# Patient Record
Sex: Female | Born: 1965
Health system: Southern US, Community
[De-identification: ages and names within clinical notes are randomized; demographics above are authoritative.]

## PROBLEM LIST (undated history)

## (undated) DIAGNOSIS — R011 Cardiac murmur, unspecified: Secondary | ICD-10-CM

---

## 2002-11-08 ENCOUNTER — Other Ambulatory Visit: Admission: RE | Admit: 2002-11-08 | Discharge: 2002-11-08 | Payer: Self-pay | Admitting: Obstetrics & Gynecology

## 2003-12-09 ENCOUNTER — Other Ambulatory Visit: Admission: RE | Admit: 2003-12-09 | Discharge: 2003-12-09 | Payer: Self-pay | Admitting: Obstetrics & Gynecology

## 2003-12-13 ENCOUNTER — Encounter: Admission: RE | Admit: 2003-12-13 | Discharge: 2003-12-13 | Payer: Self-pay | Admitting: Obstetrics & Gynecology

## 2004-12-22 ENCOUNTER — Other Ambulatory Visit: Admission: RE | Admit: 2004-12-22 | Discharge: 2004-12-22 | Payer: Self-pay | Admitting: Obstetrics & Gynecology

## 2004-12-31 ENCOUNTER — Encounter: Admission: RE | Admit: 2004-12-31 | Discharge: 2004-12-31 | Payer: Self-pay | Admitting: Obstetrics & Gynecology

## 2006-02-12 HISTORY — PX: BREAST BIOPSY: SHX20

## 2006-02-15 ENCOUNTER — Encounter: Admission: RE | Admit: 2006-02-15 | Discharge: 2006-02-15 | Payer: Self-pay | Admitting: Obstetrics & Gynecology

## 2006-02-15 ENCOUNTER — Encounter (INDEPENDENT_AMBULATORY_CARE_PROVIDER_SITE_OTHER): Payer: Self-pay | Admitting: Specialist

## 2006-02-22 ENCOUNTER — Encounter: Admission: RE | Admit: 2006-02-22 | Discharge: 2006-02-22 | Payer: Self-pay | Admitting: General Surgery

## 2007-03-13 ENCOUNTER — Encounter: Admission: RE | Admit: 2007-03-13 | Discharge: 2007-03-13 | Payer: Self-pay | Admitting: Obstetrics & Gynecology

## 2007-04-04 ENCOUNTER — Encounter: Admission: RE | Admit: 2007-04-04 | Discharge: 2007-04-04 | Payer: Self-pay | Admitting: Obstetrics & Gynecology

## 2007-12-11 ENCOUNTER — Encounter: Admission: RE | Admit: 2007-12-11 | Discharge: 2007-12-11 | Payer: Self-pay | Admitting: Obstetrics & Gynecology

## 2008-03-13 ENCOUNTER — Encounter: Admission: RE | Admit: 2008-03-13 | Discharge: 2008-03-13 | Payer: Self-pay | Admitting: Obstetrics & Gynecology

## 2009-03-18 ENCOUNTER — Ambulatory Visit: Payer: Self-pay | Admitting: Sports Medicine

## 2009-03-18 DIAGNOSIS — M2170 Unequal limb length (acquired), unspecified site: Secondary | ICD-10-CM | POA: Insufficient documentation

## 2009-03-18 DIAGNOSIS — M4120 Other idiopathic scoliosis, site unspecified: Secondary | ICD-10-CM | POA: Insufficient documentation

## 2009-03-18 DIAGNOSIS — R269 Unspecified abnormalities of gait and mobility: Secondary | ICD-10-CM

## 2009-03-18 DIAGNOSIS — M6740 Ganglion, unspecified site: Secondary | ICD-10-CM | POA: Insufficient documentation

## 2009-04-09 ENCOUNTER — Encounter: Admission: RE | Admit: 2009-04-09 | Discharge: 2009-04-09 | Payer: Self-pay | Admitting: Obstetrics & Gynecology

## 2010-04-05 ENCOUNTER — Encounter: Payer: Self-pay | Admitting: Obstetrics & Gynecology

## 2010-04-16 NOTE — Assessment & Plan Note (Signed)
Summary: NP RUNNER W/ R ARCH PAIN X 2 MOS AND L LATERAL NODULE X AUG   Vital Signs:  Patient profile:   45 year old female Height:      66 inches Weight:      147 pounds BMI:     23.81 BP sitting:   107 / 73  Vitals Entered By: Lillia Pauls CMA (March 18, 2009 10:26 AM)  History of Present Illness: Pt presents today with 2 problems.  She is a casual runner who recently completed 2 half marathons.    #1 - L lateral dorsal foot nodule that appeared in August without pain.  Since that time she reports some decrease in size but has not changed much lately.  She continues to have no pain and the nodule does not affect her day to day life or activities.    #2 - R medial foot pain.  The pain appears approximatly after 5 miles but is not severe enough to cause her to stop running. .  She does occasionally have some cramping in that area at night but does not associate it with any changes in activity level.  There is no discomfort during the day unless running greater than 5 miles.  She has not tried any medications, or specific exercsies or stretches; no orthotic footbeds.    Allergies (verified): No Known Drug Allergies  Physical Exam  General:  alert, well-developed, and well-nourished.   Msk:  Bilateral Foot Exam:  normal ROM, no joint tenderness, no joint swelling, no joint warmth, no redness over joints, and no joint deformities.    L foot:  1 cm cystic nodule on the lateral aspect along fibularis tertius tendon.  R foot: normal to exam area where she experiences pain is along insertion of post tib into navicular - not TTP  Leg Length: L = 85cm; R = 86cm Extremities:  running gait shows excess supination of left foot and ext rotation of RT foot with pronation   Impression & Recommendations:  Problem # 1:  GAIT ABNORMALITY (ICD-781.2) Assessment New  Insoles fitted with a heel lift and scaphoid pad to correct leg length disparity and provide arch support.  Pt given  strengthening exercises for her hip and ankles.    will reck in 4 to 6 weeks to see if this intervention helps  Orders: Sports Insoles (N5621)  Problem # 2:  GANGLION NOS (ICD-727.43) Assessment: New Pt advised to do exercises as above and monitor cyst for any changes.    Problem # 3:  UNEQUAL LEG LENGTH (ICD-736.81) Assessment: New  Heel pad placed; see assessment #1  Orders: Sports Insoles (H0865)  Problem # 4:  SCOLIOSIS (ICD-737.30) she stands very level and the curve is not significant and seems mostly compensated by her leg length diff  Patient Instructions: 1)  At home exercises: 2)  Foot Exercises: A) Walk Pige[place]'d toed for 10 steps on your toes back and forth 10 times.  Once you are able to do this easily progress to doing this up a set of steps. B) Stand against a wall with the outside of your left foot against the wall.  Turn your foot outwards against the wall and hold for 6 seconds.  Repeat 10 times.   3)    4)  Right hip exercises:  A) Stand on your Left leg; lift your right leg out to your right side while keeping it straight.  Do three sets of 15 reps. B) Stand on your Left  leg; Bend your right hip and knee in front of you to 90 degrees.  turn your foot outwards.  Do three sets of 15 reps 5)  Follow up in 4-6 weeks.

## 2010-04-23 ENCOUNTER — Other Ambulatory Visit: Payer: Self-pay | Admitting: Obstetrics & Gynecology

## 2010-04-23 DIAGNOSIS — Z1231 Encounter for screening mammogram for malignant neoplasm of breast: Secondary | ICD-10-CM

## 2010-05-05 ENCOUNTER — Ambulatory Visit
Admission: RE | Admit: 2010-05-05 | Discharge: 2010-05-05 | Disposition: A | Discharge status: Home / Self Care | Payer: BC Managed Care – PPO | Source: Ambulatory Visit | Attending: Obstetrics & Gynecology | Admitting: Obstetrics & Gynecology

## 2010-05-05 DIAGNOSIS — Z1231 Encounter for screening mammogram for malignant neoplasm of breast: Secondary | ICD-10-CM

## 2010-06-23 ENCOUNTER — Encounter: Payer: Self-pay | Admitting: Sports Medicine

## 2010-06-23 ENCOUNTER — Ambulatory Visit (INDEPENDENT_AMBULATORY_CARE_PROVIDER_SITE_OTHER): Payer: BC Managed Care – PPO | Admitting: Sports Medicine

## 2010-06-23 VITALS — BP 114/75 | HR 69 | Ht 66.0 in | Wt 145.0 lb

## 2010-06-23 DIAGNOSIS — M7740 Metatarsalgia, unspecified foot: Secondary | ICD-10-CM

## 2010-06-23 DIAGNOSIS — R269 Unspecified abnormalities of gait and mobility: Secondary | ICD-10-CM

## 2010-06-23 DIAGNOSIS — M25551 Pain in right hip: Secondary | ICD-10-CM

## 2010-06-23 DIAGNOSIS — M25559 Pain in unspecified hip: Secondary | ICD-10-CM

## 2010-06-23 DIAGNOSIS — M7060 Trochanteric bursitis, unspecified hip: Secondary | ICD-10-CM | POA: Insufficient documentation

## 2010-06-23 DIAGNOSIS — M76899 Other specified enthesopathies of unspecified lower limb, excluding foot: Secondary | ICD-10-CM

## 2010-06-23 DIAGNOSIS — M7750 Other enthesopathy of unspecified foot: Secondary | ICD-10-CM

## 2010-06-23 NOTE — Assessment & Plan Note (Signed)
She may be a candidate for custom orthotics if she does not get enough relief from her problems with the temporary sports insoles

## 2010-06-23 NOTE — Assessment & Plan Note (Signed)
Hip pain on the right side has been fairly persistent over the last several months. The hip joint itself does seem to be stable with excellent range of motion so this points toward involvement of the bursa. I do suspect that the leg length abnormality was left to help trigger this as it does change her gait.

## 2010-06-23 NOTE — Assessment & Plan Note (Signed)
Metatarsal pads were added to his sports in soles.  Her first pair lasted almost 9 months and gave her good relief from her symptoms. We did add a small amount of a lift on the left insole.  Today we continued with the lift on the left but add metatarsal pads to both. In addition we added some metatarsal pads to her regular shoes. She was to see how this does in relieving her symptoms and we will check her back in approximately 6 weeks.

## 2010-06-23 NOTE — Progress Notes (Signed)
  Subjective:    Patient ID: Veronica Calderon, female    DOB: 28-Oct-1965, 45 y.o.   MRN: [id number]  HPI  Pt presents to clinic for evaluation of rt foot and hip pain.   Has been using hapad temporary insoles with leg length corrected and lateral posting that have been comfortable. Started running in 2010- did 2 half marathons, in 2011 was only able to do 3 -5 Ks due to hip pain.   Rt hip is painful laterally over greater trochanter starts about 10 minutes into run.  She has been experiencing this for 6 months.  Has rt foot pain between 3rd and 4th toes plantar aspect x 1 month- feels like stinging and pressure only with weight bearing.    Has stopped running since the fall of 2011 due to hip pain.  Now riding stationary bike for exercise.   Review of Systems     Objective:   Physical Exam    Morton's foot on lt with long 2nd and 3rd toe, not as much so on rt Widening of both forefeet, small bunionette on lt Moderately high arches No calcaneal valgus Good post tib function Splaying between 1st and 2nd toes bilat Large morton's callus on rt foot 4th MT subluxed on rt, with early callusing  Rt forefoot 1.8 times width of heel Morton's callus on lt 5 th MT subluxed Lt forefoot is 1.8 times width of heel  Good hip ROM bilat Lt leg slightly shorter than RT SI joints freely move Good hip abduction strength bilat Tenderness to palpation over rt greater trochanter  Assessment & Plan:

## 2010-06-23 NOTE — Patient Instructions (Signed)
Do recommended hip stretches and exercises daily

## 2010-06-23 NOTE — Assessment & Plan Note (Signed)
Given a series of exercises for hip rotators and hip abductors. She can use some Aleve or Advil as these both seemed to help. Should it not respond to conservative care over the next 6 weeks I would consider injecting the bursa.

## 2010-07-30 ENCOUNTER — Ambulatory Visit (INDEPENDENT_AMBULATORY_CARE_PROVIDER_SITE_OTHER): Payer: BC Managed Care – PPO | Admitting: Sports Medicine

## 2010-07-30 ENCOUNTER — Encounter: Payer: Self-pay | Admitting: Sports Medicine

## 2010-07-30 VITALS — BP 107/70

## 2010-07-30 DIAGNOSIS — M25551 Pain in right hip: Secondary | ICD-10-CM

## 2010-07-30 DIAGNOSIS — M25559 Pain in unspecified hip: Secondary | ICD-10-CM

## 2010-07-30 DIAGNOSIS — M7060 Trochanteric bursitis, unspecified hip: Secondary | ICD-10-CM

## 2010-07-30 DIAGNOSIS — M76899 Other specified enthesopathies of unspecified lower limb, excluding foot: Secondary | ICD-10-CM

## 2010-07-30 NOTE — Assessment & Plan Note (Signed)
There is mild swelling of the bursa today but not any extreme tenderness or significant enough pain that she would like an injection. If the pain continues to resolve with exercises and avoiding aggravating features I don't think we need to any medications. If pain worsens we will consider using nitroglycerin protocol or proceeding with injection.  She will see how this responds over the next 2 months and return if needed.

## 2010-07-30 NOTE — Progress Notes (Signed)
  Subjective:    Patient ID: Veronica Calderon, female    DOB: 11-12-1965, 45 y.o.   MRN: [id number]  HPI  Pt presents to clinic for f/u of R hip pain.  Feels that she has improved- able to run about 20 minutes before pain starts.  Able to run a 5K without stopping due to pain - hip pain started around 22 minutes into race.  Was compliant with HEP initially but has now stopped exercises.  Started back to body pump within the past 2 weeks and feels that squats in class may have aggravated rt hip and caused flaring of rt hip pain. Pain still located near RT greater trochanter. sports insoles did help her metatarsal pain.   Review of Systems     Objective:   Physical Exam     Hip ROM normal bilat Leg length abnormality noted- Rt 1 cm longer than lt SI joints move freely FABER tight bilat lt more than rt Hip abduction strong bilat Hip rotation strong bilat Tenderness over area of rt greater trochanter- near hip rotator tendons   Musculoskeletal ultrasound Scanning of the right hip reveals that there is a mild amount of fluid in the greater trochanteric bursa The Doppler activity around this area looks normal There is some calcification in the distal portion of the tendon of piriformis muscle proximal to attachment to the greater trochanter There are no tears seen in the tendons.  Assessment & Plan:

## 2010-07-30 NOTE — Assessment & Plan Note (Signed)
She was improving significantly until she returned to body pump. I think in the body pump she's getting into some deep squat maneuvers that are allowing her to irritate the tendon again. I suggested resuming the exercises as before. Add a few more stretches for hip rotators. Avoid deep squats or positions that strain the hip rotators

## 2011-04-22 ENCOUNTER — Other Ambulatory Visit: Payer: Self-pay | Admitting: Obstetrics & Gynecology

## 2011-04-22 DIAGNOSIS — Z1231 Encounter for screening mammogram for malignant neoplasm of breast: Secondary | ICD-10-CM

## 2011-05-07 ENCOUNTER — Ambulatory Visit
Admission: RE | Admit: 2011-05-07 | Discharge: 2011-05-07 | Disposition: A | Discharge status: Home / Self Care | Payer: BC Managed Care – PPO | Source: Ambulatory Visit | Attending: Obstetrics & Gynecology | Admitting: Obstetrics & Gynecology

## 2011-05-07 DIAGNOSIS — Z1231 Encounter for screening mammogram for malignant neoplasm of breast: Secondary | ICD-10-CM

## 2011-05-11 ENCOUNTER — Other Ambulatory Visit: Payer: Self-pay | Admitting: Obstetrics & Gynecology

## 2011-05-11 DIAGNOSIS — R928 Other abnormal and inconclusive findings on diagnostic imaging of breast: Secondary | ICD-10-CM

## 2011-05-19 ENCOUNTER — Other Ambulatory Visit: Payer: Self-pay | Admitting: Obstetrics & Gynecology

## 2011-05-19 ENCOUNTER — Ambulatory Visit
Admission: RE | Admit: 2011-05-19 | Discharge: 2011-05-19 | Disposition: A | Discharge status: Home / Self Care | Payer: BC Managed Care – PPO | Source: Ambulatory Visit | Attending: Obstetrics & Gynecology | Admitting: Obstetrics & Gynecology

## 2011-05-19 DIAGNOSIS — R928 Other abnormal and inconclusive findings on diagnostic imaging of breast: Secondary | ICD-10-CM

## 2012-03-09 ENCOUNTER — Ambulatory Visit (INDEPENDENT_AMBULATORY_CARE_PROVIDER_SITE_OTHER): Payer: BC Managed Care – PPO | Admitting: Sports Medicine

## 2012-03-09 VITALS — BP 110/70 | Ht 66.0 in | Wt 146.0 lb

## 2012-03-09 DIAGNOSIS — M25519 Pain in unspecified shoulder: Secondary | ICD-10-CM

## 2012-03-09 DIAGNOSIS — M7580 Other shoulder lesions, unspecified shoulder: Secondary | ICD-10-CM

## 2012-03-09 DIAGNOSIS — M719 Bursopathy, unspecified: Secondary | ICD-10-CM

## 2012-03-09 DIAGNOSIS — M7060 Trochanteric bursitis, unspecified hip: Secondary | ICD-10-CM

## 2012-03-09 DIAGNOSIS — M76899 Other specified enthesopathies of unspecified lower limb, excluding foot: Secondary | ICD-10-CM

## 2012-03-09 MED ORDER — MELOXICAM 15 MG PO TABS: ORAL_TABLET | ORAL | Status: DC

## 2012-03-09 NOTE — Progress Notes (Signed)
  Subjective:    Patient ID: Veronica Calderon, female    DOB: 06/11/1965, 46 y.o.   MRN: [id number]  HPI chief complaint: Right shoulder pain, right hip pain  Very pleasant right-hand-dominant 46 year old female comes in today complaining of 2-3 months of right shoulder pain. Symptoms began acutely when she was lying down a book that she was reading. She placed her shoulder into an abducted externally rotated position as she laid the book down. Since then, she's had intermittent pain along the lateral shoulder which is worse with reaching out away from her body. She especially notices it with activity such as reaching in the dryer. Pain will occasionally awaken her at night. She denies problems with this shoulder in the past. She is getting some mild neck pain, no associated numbness or tingling down the right arm. Pain at times will radiate down the arm to the elbow but not into the forearm or the fingers. She's taking intermittent doses of Advil. She's found biofreeze to be the most helpful. No significant weakness.  In regards to her right hip she has been treated in the past for greater trochanteric bursitis. This was treated with a home exercise program with complete resolution of her pain. He is recently started to train for half marathon and has had a return of symptoms identical in nature to what she experienced previously. Pain is all along the lateral hip. No pain in the groin. No pain at rest.  She is otherwise healthy.    Review of Systems     Objective:   Physical Exam Well-developed, well-nourished. No acute distress. Awake alert and oriented x3  Right shoulder: Full range of motion with a positive painful ARC. No tenderness along the clavicle or over the a.c. joint. No tenderness over the bicipital groove. Rotator cuff strength is 5/5 bilaterally but reproducible pain with resisted supraspinatus on the right. Positive empty can, positive Hawkins. Neurovascularly intact  distally.  Right hip: Discrete tenderness to palpation directly over the right greater trochanteric bursa. Smooth painless hip range of motion with a negative log roll. Straight leg raise is negative. She walks without a limp.       Assessment & Plan:  1. Right shoulder pain secondary to subacromial bursitis/rotator cuff tendinitis 2. Right hip pain secondary to greater trochanteric bursitis  Patient's right subacromial space was injected with cortisone today. Mobic 15 mg daily for the next 7 days then when necessary. He is cautioned about GI upset with his medicine. A comprehensive home exercise program for both the right shoulder and the right hip were given. Patient will followup in 3 weeks. We will tentatively schedule a 30 minute ultrasound appointment for complete shoulder ultrasound. If patient is markedly improved, we may cancel the study.  Of note, she has a pair of green sports insole which are becoming worn. We will need to schedule a time to construct her some custom orthotics. We will plan on discussing this further at her next office visit.  Consent obtained and verified. Time-out conducted. Noted no overlying erythema, induration, or other signs of local infection. Skin prepped in a sterile fashion. Topical analgesic spray: Ethyl chloride. Joint: right subacromial space Needle: 25g 1 1/2 in needle Completed without difficulty. Meds: 1cc (40 mg) depomedrol, 3cc 0.5% marcaine  Advised to call if fevers/chills, erythema, induration, drainage, or persistent bleeding.

## 2012-04-03 ENCOUNTER — Ambulatory Visit (INDEPENDENT_AMBULATORY_CARE_PROVIDER_SITE_OTHER): Payer: BC Managed Care – PPO | Admitting: Sports Medicine

## 2012-04-03 VITALS — BP 100/64 | Ht 66.0 in | Wt 145.0 lb

## 2012-04-03 DIAGNOSIS — M25519 Pain in unspecified shoulder: Secondary | ICD-10-CM

## 2012-04-04 NOTE — Progress Notes (Signed)
  Subjective:    Patient ID: Veronica Calderon, female    DOB: Nov 01, 1965, 47 y.o.   MRN: [id number]  HPI  Patient comes in today for followup on right shoulder pain. She feels like she is "80% better" after a recent subacromial cortisone injection. However, she is still struggling with repetitive overhead motion. Still occasional nighttime pain. Majority of her pain is along the lateral aspect of her shoulder. She is currently training for half marathon. She bought new shoes a couple of weeks ago and after a long run began to have bilateral knee and hip pain. She resumed running and her old shoes and her symptoms improved although she is still having some slight discomfort along the lateral right hip. We did help previously about custom orthotics. She has a pair of green sports insoles which she has had for quite some time. He finds them to be quite useful and comfortable.    Review of Systems     Objective:   Physical Exam Well-developed, well-nourished. No acute distress. Awake alert and oriented x3  Right shoulder: Full range of motion with a positive painful ARC. No tenderness over the a.c. joint or over the bicipital groove. Mild pain with empty can testing. Mild pain with Hawkins testing. Rotator cuff strength is 5/5 but is reproducible of pain with resisted supraspinatus. Neurovascularly intact distally.  MSK ultrasound of the right shoulder: Images were obtained in long and short views. The supraspinatus tendon appears to have a possible partial bursal surface tear. Is best seen on the [place]. The infraspinatus and subscapularis appear to be normal. Biceps tendon is normally located in the bicipital groove and without abnormality. Posterior labrum as well visualized without obvious abnormality.       Assessment & Plan:  1. Right shoulder pain likely secondary to partial thickness supraspinatus tendon tear  Patient symptoms are improving after subacromial cortisone injection. I've asked  her to continue with her home exercise program and followup with me in 4 weeks. We will plan on repeating her ultrasound at that time. She is still taking a full 15 mg Mobic each day and I've asked that she reduce the dose to 7.5 mg daily for one week and then try to discontinue it altogether. I've explained to her that if her symptoms plateau or worsen we may need to consider merits of further diagnostic imaging. She wil continue to avoid repetitive overhead motion.  In regards to her orthotics, I've asked her to return to the office with her green sports insoles. I have no doubt that they are quite worn and need to be replaced. She is not quite ready to pursue custom orthotics yet.

## 2012-04-20 ENCOUNTER — Other Ambulatory Visit: Payer: Self-pay | Admitting: Obstetrics & Gynecology

## 2012-04-20 DIAGNOSIS — Z1231 Encounter for screening mammogram for malignant neoplasm of breast: Secondary | ICD-10-CM

## 2012-05-04 ENCOUNTER — Ambulatory Visit: Payer: BC Managed Care – PPO | Admitting: Sports Medicine

## 2012-05-04 ENCOUNTER — Ambulatory Visit (HOSPITAL_COMMUNITY)
Admission: RE | Admit: 2012-05-04 | Discharge: 2012-05-04 | Disposition: A | Discharge status: Home / Self Care | Payer: BC Managed Care – PPO | Source: Ambulatory Visit | Attending: Sports Medicine | Admitting: Sports Medicine

## 2012-05-04 ENCOUNTER — Ambulatory Visit (INDEPENDENT_AMBULATORY_CARE_PROVIDER_SITE_OTHER): Payer: BC Managed Care – PPO | Admitting: Sports Medicine

## 2012-05-04 VITALS — BP 104/62 | Ht 66.0 in | Wt 142.0 lb

## 2012-05-04 DIAGNOSIS — M25519 Pain in unspecified shoulder: Secondary | ICD-10-CM

## 2012-05-05 NOTE — Progress Notes (Signed)
  Subjective:    Patient ID: Veronica Calderon, female    DOB: June 25, 1965, 47 y.o.   MRN: [id number]  HPI Iliani comes in today with worsening right shoulder pain. She's also beginning to notice limited range of motion particularly with reaching overhead or around behind her back. Pain continues to be along the lateral aspect of the shoulder. It does awaken her at night. Subacromial cortisone injection did initially help her but it has now worn off. Ultrasound of the right shoulder at her last office visit suggested a partial thickness supraspinatus tear. Symptoms have been present now for approximately 4 months.    Review of Systems     Objective:   Physical Exam Well-developed, well-nourished. No acute distress  Right shoulder: Active and passive forward flexion and abduction are to about 150. Internal rotation is limited to 60-70. Passive external rotation is 80. Positive empty can, positive Hawkins. Positive painful ARC. Rotator cuff strength is 5/5 but reproducible of pain with resisted supraspinatus on the right. No tenderness over the a.c. no tenderness in the bicipital groove. Neurovascularly intact distally.  X-ray of the right shoulder including AP and outlet views shows a mild amount of a.c. DJD but otherwise unremarkable.     Assessment & Plan:  1. Persistent right shoulder pain possibly secondary to rotator cuff tear  Given her persistent symptoms despite conservative treatment I would like to order an MRI scan of the right shoulder to rule out a more significant rotator cuff tear than what was seen on ultrasound. I also think she is getting some early adhesive capsulitis. I will call her with her MRI results once available at which point we'll delineate further treatment. We discussed the possibility of a referral to Dr. Dion Saucier.

## 2012-05-08 ENCOUNTER — Ambulatory Visit (HOSPITAL_COMMUNITY)
Admission: RE | Admit: 2012-05-08 | Discharge: 2012-05-08 | Disposition: A | Discharge status: Home / Self Care | Payer: BC Managed Care – PPO | Source: Ambulatory Visit | Attending: Sports Medicine | Admitting: Sports Medicine

## 2012-05-08 DIAGNOSIS — M719 Bursopathy, unspecified: Secondary | ICD-10-CM | POA: Insufficient documentation

## 2012-05-08 DIAGNOSIS — M25519 Pain in unspecified shoulder: Secondary | ICD-10-CM

## 2012-05-08 DIAGNOSIS — M67919 Unspecified disorder of synovium and tendon, unspecified shoulder: Secondary | ICD-10-CM | POA: Insufficient documentation

## 2012-05-09 ENCOUNTER — Telehealth: Payer: Self-pay | Admitting: *Deleted

## 2012-05-09 ENCOUNTER — Telehealth: Payer: Self-pay | Admitting: Sports Medicine

## 2012-05-09 NOTE — Telephone Encounter (Signed)
Message copied by Mora Bellman on Tue May 09, 2012  2:50 PM ------      Message from: Ralene Cork      Created: Tue May 09, 2012 10:34 AM      Regarding: Dr Dion Saucier referal       Please refer this patient to Dr. Dion Saucier for consideration of rotator cuff debridement and manipulation under anesthesia.            ----- Message -----         From: Rad Results In Interface         Sent: 05/08/2012   3:55 PM           To: Ralene Cork, DO                   ------

## 2012-05-09 NOTE — Telephone Encounter (Signed)
I spoke with Veronica Calderon today on the phone regarding the MRI of her right shoulder. The MRI shows moderate rotator cuff tendinopathy and early adhesive capsulitis. Her symptoms are now several months in duration. My recommendation at this point is to refer her to Dr. Dion Saucier for further treatment. She may benefit greatly from an arthroscopic debridement and a manipulation under anesthesia. We will try to arrange for consultation some time this week or next and I'll defer further workup and treatment to Dr. Dion Saucier discretion. Patient will followup with me when necessary.

## 2012-05-09 NOTE — Telephone Encounter (Signed)
Scheduled pt for appt with Dr. Dion Saucier- 05/17/12 at 9:45am.  Pt notified of appt info.

## 2012-05-23 ENCOUNTER — Ambulatory Visit
Admission: RE | Admit: 2012-05-23 | Discharge: 2012-05-23 | Disposition: A | Discharge status: Home / Self Care | Payer: BC Managed Care – PPO | Source: Ambulatory Visit | Attending: Obstetrics & Gynecology | Admitting: Obstetrics & Gynecology

## 2012-05-23 DIAGNOSIS — Z1231 Encounter for screening mammogram for malignant neoplasm of breast: Secondary | ICD-10-CM

## 2012-05-25 ENCOUNTER — Other Ambulatory Visit: Payer: Self-pay | Admitting: Obstetrics & Gynecology

## 2012-05-25 DIAGNOSIS — R928 Other abnormal and inconclusive findings on diagnostic imaging of breast: Secondary | ICD-10-CM

## 2012-06-08 ENCOUNTER — Ambulatory Visit
Admission: RE | Admit: 2012-06-08 | Discharge: 2012-06-08 | Disposition: A | Discharge status: Home / Self Care | Payer: BC Managed Care – PPO | Source: Ambulatory Visit | Attending: Obstetrics & Gynecology | Admitting: Obstetrics & Gynecology

## 2012-06-08 DIAGNOSIS — R928 Other abnormal and inconclusive findings on diagnostic imaging of breast: Secondary | ICD-10-CM

## 2013-07-21 IMAGING — MG MM DIGITAL DIAGNOSTIC LIMITED*R*
3 series · 3 of 3 positions shown · non-contrast
Comparison: 05/23/2012, 05/19/2011, 05/05/2010

CLINICAL DATA: Further evaluation of possible right breast mass

DIGITAL DIAGNOSTIC RIGHT MAMMOGRAM

[R CC]
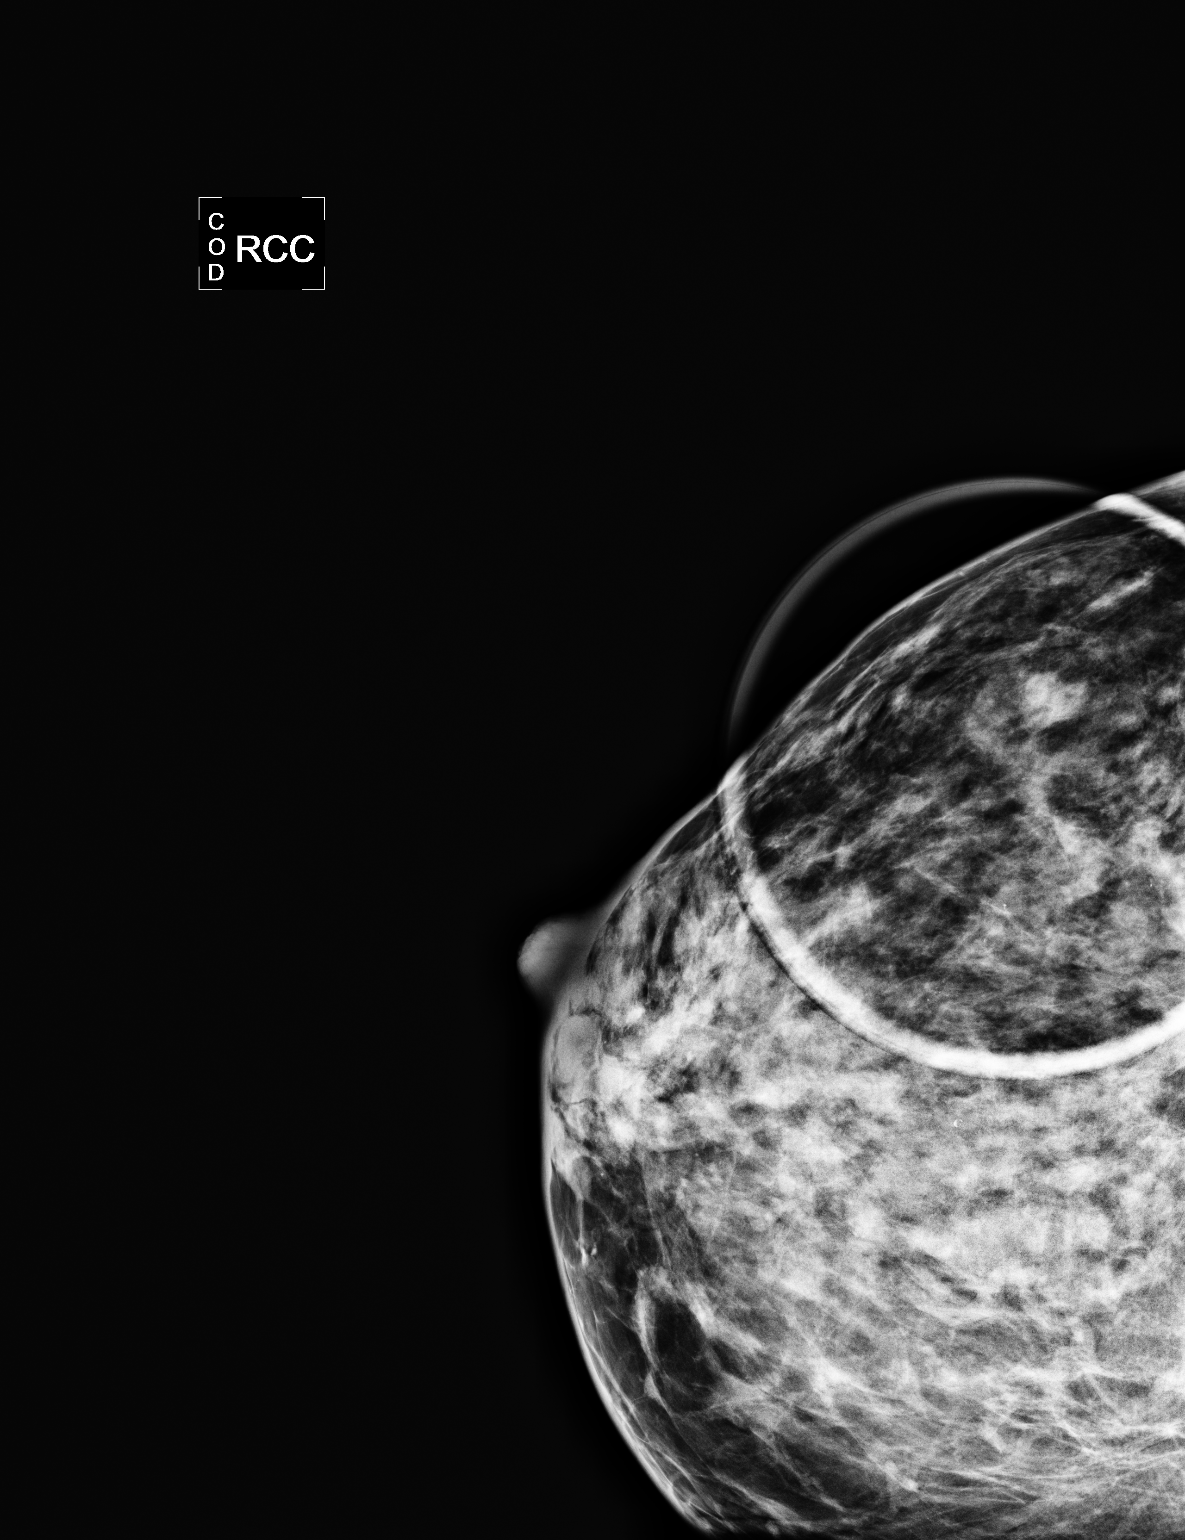

[R MLO (1 of 2)]
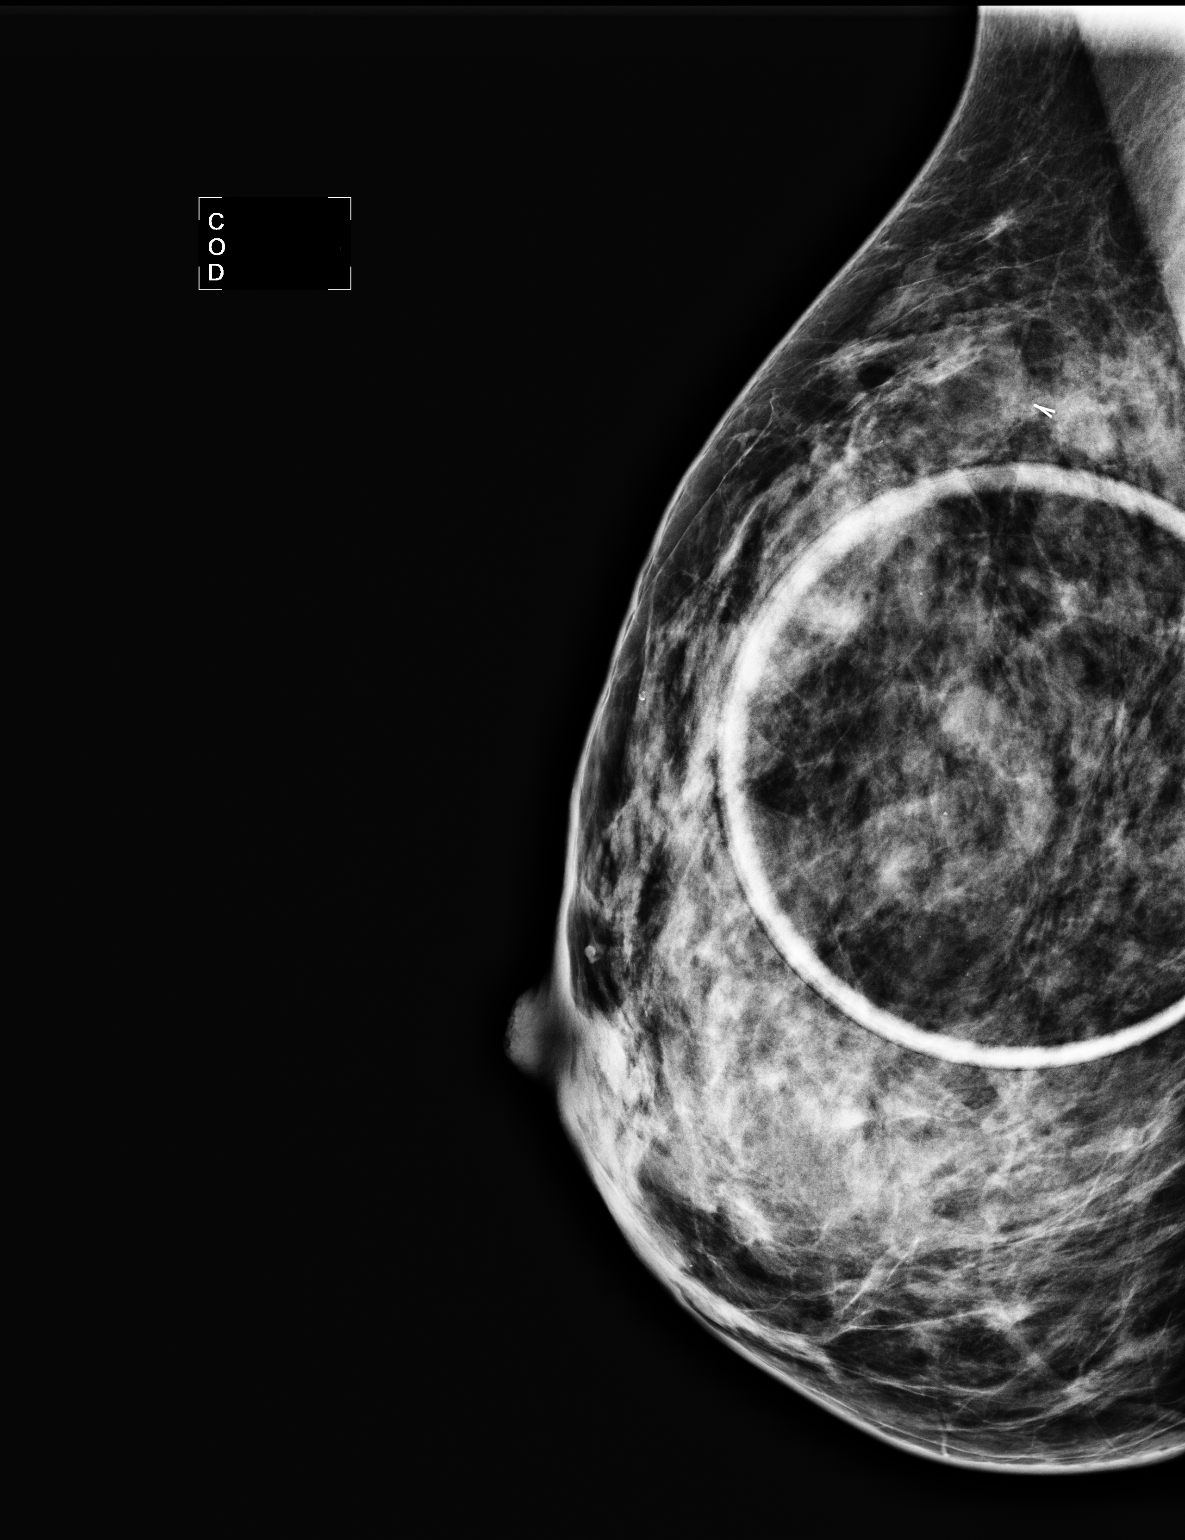

[R MLO (2 of 2)]
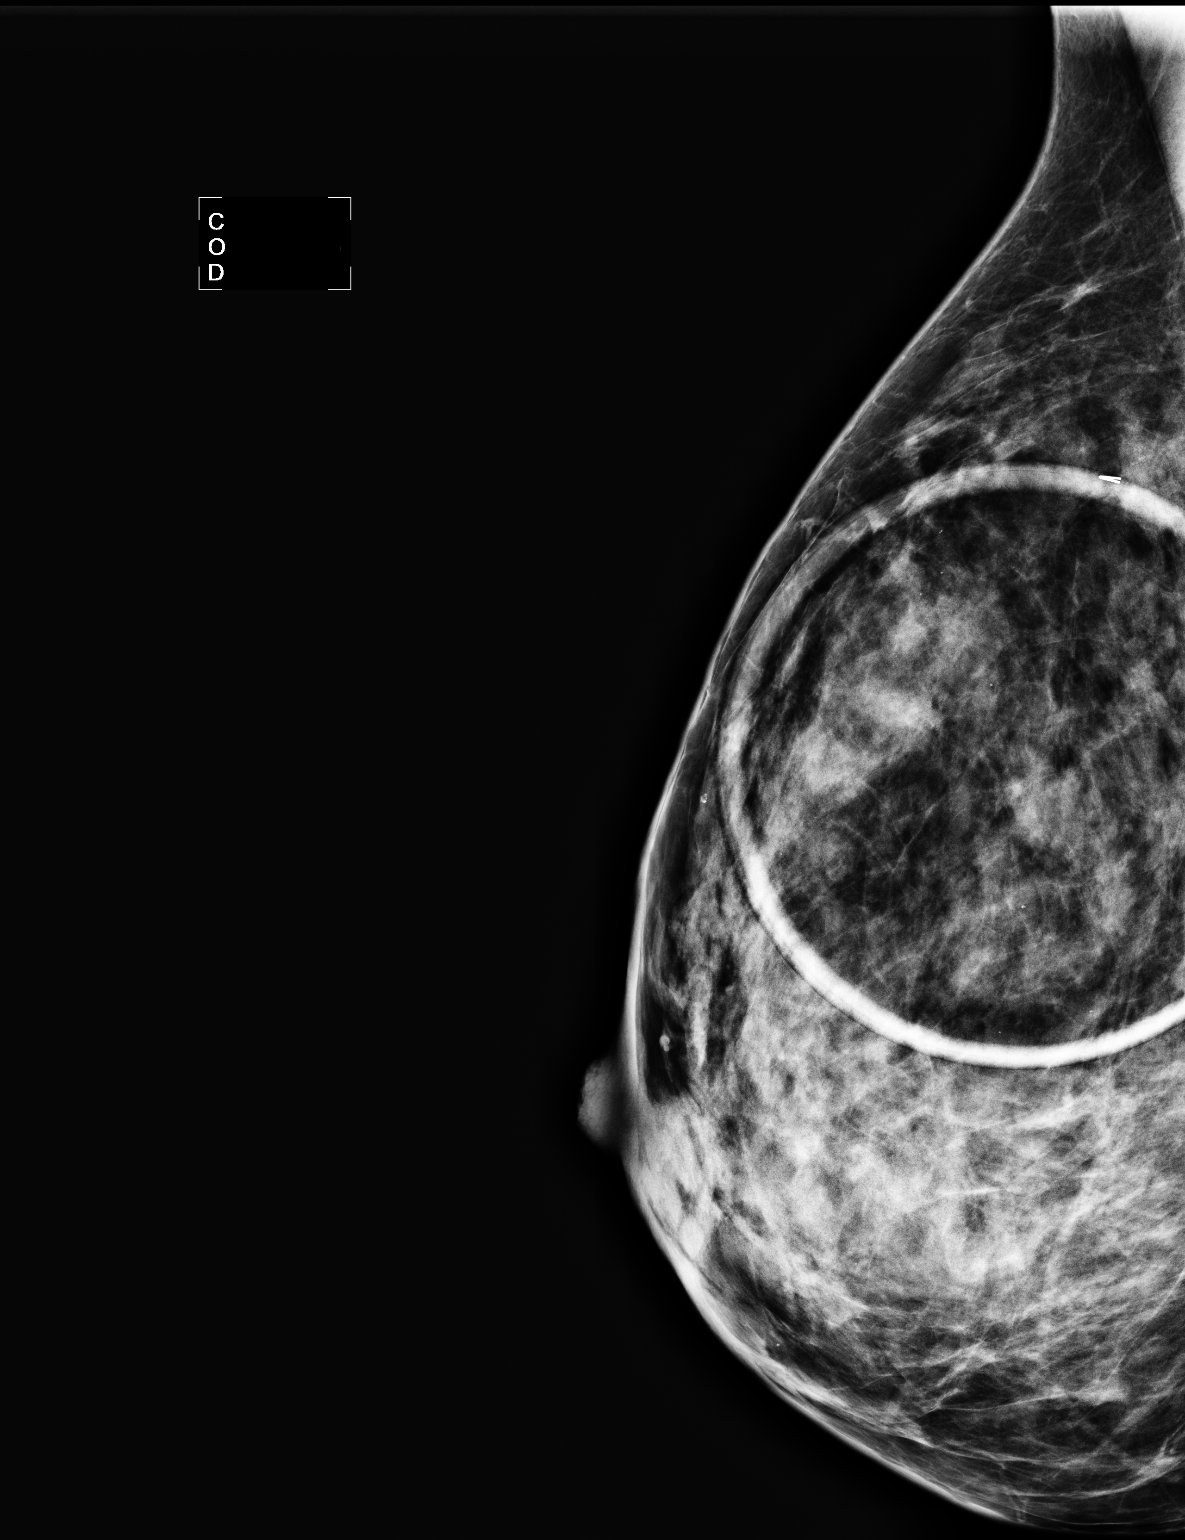

[3 of 3 positions shown; findings below may reference images not displayed]

FINDINGS: ACR Breast Density Category 3: The breast tissue is heterogeneously
dense.

There are no persistent abnormalities on spot compression views.
IMPRESSION: No persistent abnormalities

BI-RADS CATEGORY 1:  Negative.

RECOMMENDATION:
Return to annual screening mammography

I have discussed the findings and recommendations with the patient.
Results were also provided in writing at the conclusion of the
visit.  If applicable, a reminder letter will be sent to the
patient regarding her next appointment.

## 2013-10-29 ENCOUNTER — Other Ambulatory Visit: Payer: Self-pay | Admitting: Obstetrics & Gynecology

## 2013-10-30 LAB — CYTOLOGY - PAP

## 2014-10-31 ENCOUNTER — Other Ambulatory Visit: Payer: Self-pay | Admitting: Obstetrics & Gynecology

## 2014-11-01 LAB — CYTOLOGY - PAP

## 2015-06-03 MED FILL — NATURE-THROID 32.5 MG TAB: 32.5 | 30 days supply | Qty: 30 | Fill #0

## 2015-06-30 MED FILL — NATURE-THROID 32.5 MG TAB: 32.5 | 30 days supply | Qty: 30 | Fill #1

## 2015-08-01 MED FILL — NATURE-THROID 32.5 MG TAB: 32.5 | 30 days supply | Qty: 30 | Fill #2

## 2015-09-01 MED FILL — NATURE-THROID 32.5 MG TAB: 32.5 | 30 days supply | Qty: 30 | Fill #3

## 2015-10-01 MED FILL — NATURE-THROID 32.5 MG TAB: 32.5 | 30 days supply | Qty: 30 | Fill #4

## 2015-11-04 MED FILL — NATURE-THROID 32.5 MG TAB: 32.5 | 30 days supply | Qty: 30 | Fill #5

## 2015-12-03 MED FILL — NATURE-THROID 32.5 MG TAB: 32.5 | 30 days supply | Qty: 30 | Fill #6

## 2015-12-03 MED FILL — ALPRAZolam 0.5 MG TABS: 0.5 | 20 days supply | Qty: 60 | Fill #0

## 2016-01-19 ENCOUNTER — Encounter: Payer: Self-pay | Admitting: Sports Medicine

## 2016-01-19 ENCOUNTER — Ambulatory Visit (INDEPENDENT_AMBULATORY_CARE_PROVIDER_SITE_OTHER): Payer: BLUE CROSS/BLUE SHIELD | Admitting: Sports Medicine

## 2016-01-19 VITALS — Ht 66.0 in | Wt 146.0 lb

## 2016-01-19 DIAGNOSIS — M7502 Adhesive capsulitis of left shoulder: Secondary | ICD-10-CM | POA: Diagnosis not present

## 2016-01-19 MED ORDER — GABAPENTIN 300 MG PO CAPS: 300.0000 mg | ORAL_CAPSULE | Freq: Every day | ORAL | 1 refills | Status: DC

## 2016-01-19 MED ORDER — MELOXICAM 15 MG PO TABS: 15.0000 mg | ORAL_TABLET | Freq: Every day | ORAL | 1 refills | Status: DC

## 2016-01-19 MED FILL — MELOXICAM 15 MG TABLET: 15 | 30 days supply | Qty: 30 | Fill #0

## 2016-01-19 MED FILL — GABAPENTIN 300 MG CAPSULE: 300 | 30 days supply | Qty: 30 | Fill #0

## 2016-01-19 NOTE — Progress Notes (Addendum)
   Subjective:    Patient ID: Veronica Calderon, female    DOB: 02/20/1966, 50 y.o.   MRN: [id number]  HPI chief complaint: Left shoulder pain  50 year old female comes in today complaining of 2 months of left shoulder pain. She has a history of right shoulder adhesive capsulitis which resolved over a period of about 2 months with conservative treatment. She was referred to Dr. Dion SaucierLandau at the time of her diagnosis in 2014. She did well after an intra-articular cortisone injection. Her current pain in her left shoulder is identical in nature to what she experienced at that time. She has noticed increasing pain and limited range of motion particularly with overhead activity or reaching behind her back. Pain at times will radiate down from the shoulder to the elbow. She has tried some soft tissue massage but it has not been helpful. She has also been taking naproxen sodium which has been mildly helpful. She denies any known trauma. She denies numbness or tingling. Her pain does keep her from sleeping at night.  Interm medical history reviewed Medications reviewed Allergies reviewed     Review of Systems    As above  Objective:   Physical Exam  Well-developed, well-nourished. No acute distress. Awake alert and oriented 3. Vital signs reviewed  Left shoulder: Patient has severely limited active and passive range of motion including limited passive external rotation and abduction. She does have some weakness secondary to pain. No atrophy. No tenderness to palpation. Neurovascularly intact distally      Assessment & Plan:  Left shoulder pain secondary to adhesive capsulitis  Patient will be scheduled for an ultrasound-guided intra-articular glenohumeral injection for 01/20/2016. I've given her a set of home exercises which consist of range of motion exercises for her to start doing daily. I do not want her doing any shoulder strengthening and she needs to avoid those exercises in the gym because her  pain. I would like to try her on 300 mg of gabapentin daily at bedtime. We will also change her naproxen sodium to meloxicam 15 mg daily. She would also like for us to construct her a new pair of green sports insoles tomorrow when she returns for her injection. Review of her chart shows that she gets several years use out of each pair of green orthotics. I'll be happy to make her a new pair tomorrow.

## 2016-01-20 ENCOUNTER — Encounter: Payer: Self-pay | Admitting: Family Medicine

## 2016-01-20 ENCOUNTER — Ambulatory Visit (INDEPENDENT_AMBULATORY_CARE_PROVIDER_SITE_OTHER): Payer: BLUE CROSS/BLUE SHIELD | Admitting: Family Medicine

## 2016-01-20 VITALS — BP 122/66 | HR 67 | Ht 66.0 in | Wt 146.0 lb

## 2016-01-20 DIAGNOSIS — M2170 Unequal limb length (acquired), unspecified site: Secondary | ICD-10-CM | POA: Diagnosis not present

## 2016-01-20 DIAGNOSIS — M7740 Metatarsalgia, unspecified foot: Secondary | ICD-10-CM | POA: Diagnosis not present

## 2016-01-20 DIAGNOSIS — M7502 Adhesive capsulitis of left shoulder: Secondary | ICD-10-CM | POA: Diagnosis not present

## 2016-01-20 NOTE — Progress Notes (Signed)
  Veronica Calderon - 50 y.o. female MRN [id number]  Date of birth: 07/08/1965  SUBJECTIVE:  Including CC & ROS.    Ms. Veronica Calderon is a 50 year old female that is presenting for left glenohumeral joint injection for adhesive capsulitis.  HISTORY: Past Medical, Surgical, Social, and Family History Reviewed & Updated per EMR.   Pertinent Historical Findings include: PMSHx -  Scoliosis, adhesive capsulitis   DATA REVIEWED: None to review  PHYSICAL EXAM:  VS: BP:122/66  HR:67bpm  TEMP: ( )  RESP:   HT:5\' 6"  (167.6 cm)   WT:146 lb (66.2 kg)  BMI:23.6   Aspiration/Injection Procedure Note Veronica Calderon 07/08/1965  Procedure: Injection Indications: Left shoulder adhesive capsulitis  Procedure Details Consent: Risks of procedure as well as the alternatives and risks of each were explained to the (patient/caregiver).  Consent for procedure obtained. Time Out: Verified patient identification, verified procedure, site/side was marked, verified correct patient position, special equipment/implants available, medications/allergies/relevent history reviewed, required imaging and test results available.  Performed.  The area was cleaned with iodine and alcohol swabs.    The left shoulder glenohumeral joint was injected using 2 cc's of 40 mg Depomedrol and 8 cc's of 1% lidocaine with a 21 1 1/2" needle.  Ultrasound was used.  A sterile dressing was applied.  Patient did tolerate procedure well.   ASSESSMENT & PLAN:   Adhesive capsulitis of left shoulder She received an intra-articular joint injection today. - She will follow-up with Dr. Margaretha Sheffieldraper as scheduled.

## 2016-01-21 MED ORDER — METHYLPREDNISOLONE ACETATE 40 MG/ML IJ SUSP: 40.0000 mg | Freq: Once | INTRAMUSCULAR | Status: DC

## 2016-01-22 DIAGNOSIS — M7502 Adhesive capsulitis of left shoulder: Secondary | ICD-10-CM | POA: Insufficient documentation

## 2016-01-22 NOTE — Assessment & Plan Note (Signed)
She received an intra-articular joint injection today. - She will follow-up with Dr. Margaretha Sheffieldraper as scheduled.

## 2016-02-16 ENCOUNTER — Encounter: Payer: Self-pay | Admitting: Sports Medicine

## 2016-02-16 ENCOUNTER — Ambulatory Visit (INDEPENDENT_AMBULATORY_CARE_PROVIDER_SITE_OTHER): Payer: BLUE CROSS/BLUE SHIELD | Admitting: Sports Medicine

## 2016-02-16 VITALS — BP 126/71 | HR 61 | Ht 66.0 in | Wt 146.0 lb

## 2016-02-16 DIAGNOSIS — M7502 Adhesive capsulitis of left shoulder: Secondary | ICD-10-CM | POA: Diagnosis not present

## 2016-02-17 NOTE — Progress Notes (Signed)
   Subjective:    Patient ID: Veronica Calderon, female    DOB: 1965/05/14, 50 y.o.   MRN: [id number]  HPI   Patient comes in today for follow-up on left shoulder adhesive capsulitis. She thinks that the ultrasound-guided intra-articular cortisone injection done a month ago has been helpful. She has regained some range of motion and her pain has improved. She is able to sleep much better at night. She is taking gabapentin only as needed. Also taking Motrin as needed. She has started working out at [employer] again. She is also doing her range of motion exercises at home.    Review of Systems As above    Objective:   Physical Exam  Well-developed, well-nourished. No acute distress.  Left shoulder: Active and passive external rotation demonstrate about 20 of rotation. Internal rotation is 50-60. Passive and active abduction 90. Good strength. Neurovascularly intact distally.      Assessment & Plan:   Left shoulder adhesive capsulitis  Patient has made some improvement since her last office visit. She will continue with her range of motion exercises at home. I have cautioned her about starting a strengthening program in the gym. I want her to avoid any sort of strengthening exercises that cause discomfort. She may continue with her gabapentin and Motrin and will follow-up with me again in 6 weeks. She has a history of adhesive capsulitis in the right shoulder which resolved over a period of about 4-6 months. Hopefully she will get the same results this time with her left shoulder. She will call with questions or concerns prior to her follow-up visit.

## 2016-03-22 ENCOUNTER — Other Ambulatory Visit: Payer: BLUE CROSS/BLUE SHIELD | Admitting: Sports Medicine

## 2016-04-15 ENCOUNTER — Telehealth: Payer: BLUE CROSS/BLUE SHIELD | Admitting: Physician Assistant

## 2016-04-15 DIAGNOSIS — R6889 Other general symptoms and signs: Secondary | ICD-10-CM

## 2016-04-15 MED ORDER — OSELTAMIVIR PHOSPHATE 75 MG PO CAPS
75.0000 mg | ORAL_CAPSULE | Freq: Two times a day (BID) | ORAL | 0 refills | Status: DC
Start: 2016-04-15 — End: 2017-06-14

## 2016-04-15 NOTE — Progress Notes (Signed)

## 2016-06-18 MED FILL — NATURE-THROID 32.5 MG TAB: 32.5 | 30 days supply | Qty: 30 | Fill #0

## 2016-08-17 MED FILL — NATURE-THROID 32.5 MG TAB: 32.5 | 30 days supply | Qty: 30 | Fill #1

## 2016-09-02 DIAGNOSIS — H52223 Regular astigmatism, bilateral: Secondary | ICD-10-CM | POA: Diagnosis not present

## 2016-09-10 MED FILL — NATURE-THROID 32.5 MG TAB: 32.5 | 30 days supply | Qty: 30 | Fill #2

## 2016-10-20 MED FILL — NATURE-THROID 32.5 MG TAB: 32.5 | 30 days supply | Qty: 30 | Fill #3

## 2016-11-22 MED FILL — NATURE-THROID 32.5 MG TAB: 32.5 | 30 days supply | Qty: 30 | Fill #0

## 2016-12-21 MED FILL — NATURE-THROID 32.5 MG TAB: 32.5 | 30 days supply | Qty: 30 | Fill #1

## 2017-01-20 DIAGNOSIS — Z01419 Encounter for gynecological examination (general) (routine) without abnormal findings: Secondary | ICD-10-CM | POA: Diagnosis not present

## 2017-01-20 DIAGNOSIS — Z1329 Encounter for screening for other suspected endocrine disorder: Secondary | ICD-10-CM | POA: Diagnosis not present

## 2017-01-20 DIAGNOSIS — Z1231 Encounter for screening mammogram for malignant neoplasm of breast: Secondary | ICD-10-CM | POA: Diagnosis not present

## 2017-01-20 DIAGNOSIS — Z6824 Body mass index (BMI) 24.0-24.9, adult: Secondary | ICD-10-CM | POA: Diagnosis not present

## 2017-01-24 MED FILL — NATURE-THROID 32.5 MG TAB: 32.5 | 30 days supply | Qty: 30 | Fill #2

## 2017-02-23 MED FILL — NATURE-THROID 32.5 MG TAB: 32.5 | 30 days supply | Qty: 30 | Fill #3

## 2017-03-21 DIAGNOSIS — N921 Excessive and frequent menstruation with irregular cycle: Secondary | ICD-10-CM | POA: Diagnosis not present

## 2017-03-31 MED FILL — NATURE-THROID 32.5 MG TAB: 32.5 | 30 days supply | Qty: 30 | Fill #4

## 2017-05-09 MED FILL — NATURE-THROID 32.5 MG TAB: 32.5 | 30 days supply | Qty: 30 | Fill #5

## 2017-06-14 ENCOUNTER — Other Ambulatory Visit: Payer: Self-pay | Admitting: Podiatry

## 2017-06-14 ENCOUNTER — Ambulatory Visit (INDEPENDENT_AMBULATORY_CARE_PROVIDER_SITE_OTHER): Payer: BLUE CROSS/BLUE SHIELD | Admitting: Podiatry

## 2017-06-14 ENCOUNTER — Ambulatory Visit (INDEPENDENT_AMBULATORY_CARE_PROVIDER_SITE_OTHER): Payer: BLUE CROSS/BLUE SHIELD

## 2017-06-14 ENCOUNTER — Encounter: Payer: Self-pay | Admitting: Podiatry

## 2017-06-14 VITALS — BP 122/74 | HR 58 | Resp 16

## 2017-06-14 DIAGNOSIS — M778 Other enthesopathies, not elsewhere classified: Secondary | ICD-10-CM

## 2017-06-14 DIAGNOSIS — M2010 Hallux valgus (acquired), unspecified foot: Secondary | ICD-10-CM

## 2017-06-14 DIAGNOSIS — M779 Enthesopathy, unspecified: Secondary | ICD-10-CM | POA: Diagnosis not present

## 2017-06-14 DIAGNOSIS — M7751 Other enthesopathy of right foot: Secondary | ICD-10-CM | POA: Diagnosis not present

## 2017-06-14 DIAGNOSIS — M76821 Posterior tibial tendinitis, right leg: Secondary | ICD-10-CM

## 2017-06-14 MED FILL — NATURE-THROID 32.5 MG TAB: 32.5 | 30 days supply | Qty: 30 | Fill #6

## 2017-06-14 NOTE — Progress Notes (Signed)
  Subjective:  Patient ID: Veronica Calderon, female    DOB: 02-25-1966,  MRN: [id number] HPI Chief Complaint  Patient presents with  . Foot Pain    Medial foot and ankle right - patient states she went on the 4 mile run on Saturday and the next day she suddenly had sharp pains when she got up, took Aleve and rested it, feels better today but was concerned about what had happened  . Foot Pain    Questions regarding bunions and foot health in general  . New Patient (Initial Visit)    52 y.o. female presents with the above complaint.   ROS: Denies fever chills nausea vomiting muscle aches pains shortness of breath calf pain chest pain headache.  No past medical history on file.   Current Outpatient Medications:  .  thyroid (ARMOUR) 32.5 MG tablet, Take 32.5 mg by mouth daily., Disp: , Rfl:  .  LO LOESTRIN FE 1 MG-10 MCG / 10 MCG tablet, TK 1 T PO QD, Disp: , Rfl: 12  No Known Allergies Review of Systems Objective:   Vitals:   06/14/17 0857  BP: 122/74  Pulse: (!) 58  Resp: 16    General: Well developed, nourished, in no acute distress, alert and oriented x3   Dermatological: Skin is warm, dry and supple bilateral. Nails x 10 are well maintained; remaining integument appears unremarkable at this time. There are no open sores, no preulcerative lesions, no rash or signs of infection present.  Vascular: Dorsalis Pedis artery and Posterior Tibial artery pedal pulses are 2/4 bilateral with immedate capillary fill time. Pedal hair growth present. No varicosities and no lower extremity edema present bilateral.   Neruologic: Grossly intact via light touch bilateral. Vibratory intact via tuning fork bilateral. Protective threshold with Semmes Wienstein monofilament intact to all pedal sites bilateral. Patellar and Achilles deep tendon reflexes 2+ bilateral. No Babinski or clonus noted bilateral.   Musculoskeletal: No gross boney pedal deformities bilateral. No pain, crepitus, or limitation  noted with foot and ankle range of motion bilateral. Muscular strength 5/5 in all groups tested bilateral.  Mild fluctuance beneath the medial malleolus overlying the posterior tibial tendon.  She seemed to tolerate this pretty well and has no loss or deficit.  Gait: Unassisted, Nonantalgic.    Radiographs:  Radiographs taken today demonstrate an osseously mature individual no acute findings.  Mild hallux interphalangeal is noted.  Assessment & Plan:   Assessment: Resolving posterior tibial tendinitis.    Plan: Discussed etiology pathology conservative versus surgical therapies.  At this point discussed appropriate shoe gear stretching exercises ice therapy I will follow-up with her as needed.     Gunnard Dorrance T. BurgawHyatt, North DakotaDPM

## 2017-06-14 NOTE — Patient Instructions (Signed)
Posterior Tibialis Tendinosis Posterior tibialis tendinosis is irritation and degeneration of a tendon called the posterior tibial tendon. Your posterior tibial tendon is a cord-like tissue that connects bones of your lower leg and foot to a muscle that:  Supports your arch.  Helps you raise up on your toes.  Helps you turn your foot down and in.  This condition causes foot and ankle pain and can lead to a flat foot. What are the causes? This condition is most often caused by repeated stress to the tendon (overuse injury). It can also be caused by a sudden injury that stresses the tendon, such as landing on your foot after jumping or falling. What increases the risk? This condition is more likely to develop in:  People who play a sport that involves putting a lot of pressure on the feet, such as: ? Basketball. ? Tennis. ? Soccer. ? Hockey.  Runners.  Females who are older than 40 years and are overweight.  People with diabetes.  People with decreased foot stability (ligamentous laxity).  People with flat feet.  What are the signs or symptoms? Symptoms of this condition may start suddenly or gradually. Symptoms include:  Pain in the inner ankle.  Pain at the arch of your foot.  Pain that gets worse with running, walking, or standing.  Swelling on the inside of your ankle and foot.  Weakness in your ankle or foot.  Inability to stand up on tiptoe.  How is this diagnosed? This condition may be diagnosed based on:  Your symptoms.  Your medical history.  A physical exam.  Tests, such as: ? An X-ray. ? MRI. ? An ultrasound.  During the physical exam, your health care provider may move your foot and ankle, test your strength and balance, and check the arch of your foot while you stand or walk. How is this treated? This condition may be treated by:  Replacing high-impact exercise with low-impact exercise, such as swimming or cycling.  Applying ice to the  injured area.  Taking an anti-inflammatory pain medicine.  Physical therapy.  Wearing a special shoe or shoe insert to support your arch (orthotic).  If your symptoms do not improve with these treatments, you may need to wear a splint, removable walking boot, or short leg cast for 6-8 weeks to keep your foot and ankle still. Follow these instructions at home: If you have a boot or splint:  Wear the boot or splint as told by your health care provider. Remove it only as told by your health care provider.  Do not use your foot to support (bear) your full body weight until your health care provider says that you can.  Loosen the boot or splint if your toes tingle, become numb, or turn cold and blue.  Keep the boot or splint clean.  If your boot or splint is not waterproof: ? Do not let it get wet. ? Cover it with a watertight plastic bag when you take a bath or shower. If you have a cast:  Do not stick anything inside the cast to scratch your skin. Doing that increases your risk of infection.  Check the skin around the cast every day. Tell your health care provider about any concerns.  You may put lotion on dry skin around the edges of the cast. Do not apply lotion to the skin underneath the cast.  Keep the cast clean.  Do not take baths, swim, or use a hot tub until your health care   provider approves. Ask your health care provider if you can take showers. You may only be allowed to take sponge baths for bathing.  If your cast is not waterproof: ? Do not let it get wet. ? Cover it with a watertight plastic bag while you take a bath or a shower. Managing pain and swelling  Take over-the-counter and prescription medicines only as told by your health care provider.  If directed, apply ice to the injured area: ? Put ice in a plastic bag. ? Place a towel between your skin and the bag. ? Leave the ice on for 20 minutes, 2-3 times a day.  Raise (elevate) your ankle above the level  of your heart when resting if you have swelling. Activity  Do not do activities that make pain or swelling worse.  Return to full activity gradually as symptoms improve.  Do exercises as told by your health care provider. General instructions  If you have an orthotic, use it as told by your health care provider.  Keep all follow-up visits as told by your health care provider. This is important. How is this prevented?  Wear footwear that is appropriate to your athletic activity.  Avoid athletic activities that cause pain or swelling in your ankle or foot.  Before being active, do range-of-motion and stretching exercises.  If you develop pain or swelling while training, stop training.  If you have pain or swelling that does not improve after a few days of rest, see your health care provider.  If you start a new athletic activity, start gradually so you can build up your strength and flexibility. Contact a health care provider if:  Your symptoms get worse.  Your symptoms do not improve in 6-8 weeks.  You develop new, unexplained symptoms.  Your splint, boot, or cast gets damaged. This information is not intended to replace advice given to you by your health care provider. Make sure you discuss any questions you have with your health care provider. Document Released: 03/01/2005 Document Revised: 11/04/2015 Document Reviewed: 11/15/2014 Elsevier Interactive Patient Education  2018 Elsevier Inc.  

## 2017-07-25 MED FILL — NATURE-THROID 32.5 MG TAB: 32.5 | 30 days supply | Qty: 30 | Fill #7

## 2017-08-25 MED FILL — NATURE-THROID 32.5 MG TAB: 32.5 | 30 days supply | Qty: 30 | Fill #8

## 2017-09-02 DIAGNOSIS — H2511 Age-related nuclear cataract, right eye: Secondary | ICD-10-CM | POA: Diagnosis not present

## 2017-09-02 DIAGNOSIS — H5203 Hypermetropia, bilateral: Secondary | ICD-10-CM | POA: Diagnosis not present

## 2017-09-02 DIAGNOSIS — H2512 Age-related nuclear cataract, left eye: Secondary | ICD-10-CM | POA: Diagnosis not present

## 2017-09-02 DIAGNOSIS — H524 Presbyopia: Secondary | ICD-10-CM | POA: Diagnosis not present

## 2017-09-29 MED FILL — NATURE-THROID 32.5 MG TAB: 32.5 | 30 days supply | Qty: 30 | Fill #0

## 2017-10-28 MED FILL — NATURE-THROID 32.5 MG TAB: 32.5 | 30 days supply | Qty: 30 | Fill #1

## 2017-12-07 MED FILL — NATURE-THROID 32.5 MG TAB: 32.5 | 30 days supply | Qty: 30 | Fill #2

## 2018-01-10 MED FILL — NATURE-THROID 32.5 MG TAB: 32.5 | 30 days supply | Qty: 30 | Fill #0

## 2018-02-13 MED FILL — NATURE-THROID 32.5 MG TAB: 32.5 | 30 days supply | Qty: 30 | Fill #1

## 2018-02-21 DIAGNOSIS — Z01419 Encounter for gynecological examination (general) (routine) without abnormal findings: Secondary | ICD-10-CM | POA: Diagnosis not present

## 2018-02-21 DIAGNOSIS — Z6824 Body mass index (BMI) 24.0-24.9, adult: Secondary | ICD-10-CM | POA: Diagnosis not present

## 2018-02-21 DIAGNOSIS — Z1231 Encounter for screening mammogram for malignant neoplasm of breast: Secondary | ICD-10-CM | POA: Diagnosis not present

## 2018-03-22 MED FILL — NATURE-THROID 32.5 MG TAB: 32.5 | 30 days supply | Qty: 30 | Fill #0

## 2018-04-12 DIAGNOSIS — K6289 Other specified diseases of anus and rectum: Secondary | ICD-10-CM | POA: Diagnosis not present

## 2018-04-12 DIAGNOSIS — L718 Other rosacea: Secondary | ICD-10-CM | POA: Diagnosis not present

## 2018-04-12 DIAGNOSIS — Z1211 Encounter for screening for malignant neoplasm of colon: Secondary | ICD-10-CM | POA: Diagnosis not present

## 2018-04-20 DIAGNOSIS — L570 Actinic keratosis: Secondary | ICD-10-CM | POA: Diagnosis not present

## 2018-04-24 MED FILL — NATURE-THROID 32.5 MG TAB: 32.5 | 30 days supply | Qty: 30 | Fill #1

## 2018-05-22 DIAGNOSIS — K635 Polyp of colon: Secondary | ICD-10-CM | POA: Diagnosis not present

## 2018-05-22 DIAGNOSIS — K648 Other hemorrhoids: Secondary | ICD-10-CM | POA: Diagnosis not present

## 2018-05-22 DIAGNOSIS — D120 Benign neoplasm of cecum: Secondary | ICD-10-CM | POA: Diagnosis not present

## 2018-05-22 DIAGNOSIS — Z1211 Encounter for screening for malignant neoplasm of colon: Secondary | ICD-10-CM | POA: Diagnosis not present

## 2018-05-24 MED FILL — NATURE-THROID 32.5 MG TAB: 32.5 | 30 days supply | Qty: 30 | Fill #2

## 2018-07-12 MED FILL — NATURE-THROID 32.5 MG TAB: 32.5 | 30 days supply | Qty: 30 | Fill #0

## 2019-03-22 DIAGNOSIS — Z01419 Encounter for gynecological examination (general) (routine) without abnormal findings: Secondary | ICD-10-CM | POA: Diagnosis not present

## 2019-03-22 DIAGNOSIS — Z6825 Body mass index (BMI) 25.0-25.9, adult: Secondary | ICD-10-CM | POA: Diagnosis not present

## 2019-03-22 DIAGNOSIS — Z1231 Encounter for screening mammogram for malignant neoplasm of breast: Secondary | ICD-10-CM | POA: Diagnosis not present

## 2019-03-26 DIAGNOSIS — H5203 Hypermetropia, bilateral: Secondary | ICD-10-CM | POA: Diagnosis not present

## 2019-04-16 DIAGNOSIS — Z13220 Encounter for screening for lipoid disorders: Secondary | ICD-10-CM | POA: Diagnosis not present

## 2019-04-16 DIAGNOSIS — Z13228 Encounter for screening for other metabolic disorders: Secondary | ICD-10-CM | POA: Diagnosis not present

## 2019-04-16 DIAGNOSIS — N951 Menopausal and female climacteric states: Secondary | ICD-10-CM | POA: Diagnosis not present

## 2019-04-16 DIAGNOSIS — Z13820 Encounter for screening for osteoporosis: Secondary | ICD-10-CM | POA: Diagnosis not present

## 2019-04-16 DIAGNOSIS — Z130 Encounter for screening for diseases of the blood and blood-forming organs and certain disorders involving the immune mechanism: Secondary | ICD-10-CM | POA: Diagnosis not present

## 2019-04-16 DIAGNOSIS — Z1329 Encounter for screening for other suspected endocrine disorder: Secondary | ICD-10-CM | POA: Diagnosis not present

## 2019-07-02 MED FILL — NITROFURANTOIN MONO-MCR 100: 100 | 7 days supply | Qty: 14 | Fill #0

## 2020-03-08 ENCOUNTER — Encounter: Payer: Self-pay | Admitting: Physician Assistant

## 2020-03-08 ENCOUNTER — Telehealth: Payer: BLUE CROSS/BLUE SHIELD | Admitting: Physician Assistant

## 2020-03-08 DIAGNOSIS — N3000 Acute cystitis without hematuria: Secondary | ICD-10-CM

## 2020-03-08 MED ORDER — NITROFURANTOIN MONOHYD MACRO 100 MG PO CAPS: 100.0000 mg | ORAL_CAPSULE | Freq: Two times a day (BID) | ORAL | 0 refills | Status: DC

## 2020-03-08 NOTE — Progress Notes (Signed)
We are sorry that you are not feeling well.  Here is how we plan to help!  Based on what you shared with me it looks like you most likely have a simple urinary tract infection.  A UTI (Urinary Tract Infection) is a bacterial infection of the bladder.  Most cases of urinary tract infections are simple to treat but a key part of your care is to encourage you to drink plenty of fluids and watch your symptoms carefully.  I have prescribed MacroBid 100 mg twice a day for 5 days.  Your symptoms should gradually improve. Call us if the burning in your urine worsens, you develop worsening fever, back pain or pelvic pain or if your symptoms do not resolve after completing the antibiotic.  Urinary tract infections can be prevented by drinking plenty of water to keep your body hydrated.  Also be sure when you wipe, wipe from front to back and don't hold it in!  If possible, empty your bladder every 4 hours.  Your e-visit answers were reviewed by a board certified advanced clinical practitioner to complete your personal care plan.  Depending on the condition, your plan could have included both over the counter or prescription medications.  If there is a problem please reply  once you have received a response from your provider.  Your safety is important to us.  If you have drug allergies check your prescription carefully.    You can use MyChart to ask questions about today's visit, request a non-urgent call back, or ask for a work or school excuse for 24 hours related to this e-Visit. If it has been greater than 24 hours you will need to follow up with your provider, or enter a new e-Visit to address those concerns.   You will get an e-mail in the next two days asking about your experience.  I hope that your e-visit has been valuable and will speed your recovery. Thank you for using e-visits.   I spent 5-10 minutes on review and completion of this note- Deontez Klinke PAC  

## 2020-04-03 DIAGNOSIS — Z6824 Body mass index (BMI) 24.0-24.9, adult: Secondary | ICD-10-CM | POA: Diagnosis not present

## 2020-04-03 DIAGNOSIS — Z01419 Encounter for gynecological examination (general) (routine) without abnormal findings: Secondary | ICD-10-CM | POA: Diagnosis not present

## 2020-04-03 DIAGNOSIS — Z1231 Encounter for screening mammogram for malignant neoplasm of breast: Secondary | ICD-10-CM | POA: Diagnosis not present

## 2020-04-03 DIAGNOSIS — N841 Polyp of cervix uteri: Secondary | ICD-10-CM | POA: Diagnosis not present

## 2020-04-03 DIAGNOSIS — N939 Abnormal uterine and vaginal bleeding, unspecified: Secondary | ICD-10-CM | POA: Diagnosis not present

## 2020-04-07 ENCOUNTER — Other Ambulatory Visit: Payer: Self-pay | Admitting: Obstetrics & Gynecology

## 2020-04-07 DIAGNOSIS — R928 Other abnormal and inconclusive findings on diagnostic imaging of breast: Secondary | ICD-10-CM

## 2020-04-18 ENCOUNTER — Ambulatory Visit
Admission: RE | Admit: 2020-04-18 | Discharge: 2020-04-18 | Disposition: A | Discharge status: Home / Self Care | Payer: BC Managed Care – PPO | Source: Ambulatory Visit | Attending: Obstetrics & Gynecology | Admitting: Obstetrics & Gynecology

## 2020-04-18 ENCOUNTER — Other Ambulatory Visit: Payer: Self-pay

## 2020-04-18 DIAGNOSIS — R928 Other abnormal and inconclusive findings on diagnostic imaging of breast: Secondary | ICD-10-CM

## 2020-04-18 DIAGNOSIS — R922 Inconclusive mammogram: Secondary | ICD-10-CM | POA: Diagnosis not present

## 2020-04-18 DIAGNOSIS — N6001 Solitary cyst of right breast: Secondary | ICD-10-CM | POA: Diagnosis not present

## 2020-05-17 ENCOUNTER — Telehealth: Payer: BC Managed Care – PPO | Admitting: Physician Assistant

## 2020-05-17 DIAGNOSIS — R21 Rash and other nonspecific skin eruption: Secondary | ICD-10-CM

## 2020-05-17 MED ORDER — PREDNISONE 10 MG PO TABS: ORAL_TABLET | ORAL | 0 refills | Status: DC

## 2020-05-17 NOTE — Progress Notes (Addendum)
Virtual Visit via Video Note  I connected with Veronica Calderon on 05/17/20 at 12:36 pm   by a video enabled telemedicine application and verified that I am speaking with the correct person using two identifiers.  Location: Patient: home  Provider: provider's office  Person participating in the virtual visit: patient and provider    I discussed the limitations of evaluation and management by telemedicine and the availability of in person appointments. The patient expressed understanding and agreed to proceed.   I discussed the assessment and treatment plan with the patient. The patient was provided an opportunity to ask questions and all were answered. The patient agreed with the plan and demonstrated an understanding of the instructions.   The patient was advised to call back or seek an in-person evaluation if the symptoms worsen or if the condition fails to improve as anticipated.    Waldon Merl, PA-C     Acute Office Visit  Subjective:    Patient ID: Veronica Calderon, female    DOB: 24-Nov-1965, 55 y.o.   MRN: [id number]  No chief complaint on file.   55 yo F in NAD has rash x 6 days. The rash started after taking a walk in the woods. The rash is small blisters in linear streaking with some small papules on the bilateral arms and leg, associated with itching. Has been taking Bendaryl which helps with itching. Denies any rash in webs of finger or between toes, swelling of the face/lip/thorat, dyspnea or any compromise to airway. Denies any dermatomal distribution of the rash. Denies any fever, chills, cough, sore throat, changes in smell/taste, dyspnea, pain with rash, surrounding redness, swelling, skin warmth, induration, fluctuance, discharge. Denies any previous episodes. Denies any change in body products or use of new meds. Denies coming in contact with any known allergen. Denies any sxs related to Covid 19 such as fever,   Patient is in today for rash  No past medical history on  file.  Past Surgical History:  Procedure Laterality Date  . BREAST BIOPSY Right 02/2006   Benign    No family history on file.  Social History   Socioeconomic History  . Marital status: Married    Spouse name: Not on file  . Number of children: Not on file  . Years of education: Not on file  . Highest education level: Not on file  Occupational History  . Not on file  Tobacco Use  . Smoking status: Never Smoker  . Smokeless tobacco: Never Used  Substance and Sexual Activity  . Alcohol use: Yes  . Drug use: Not on file  . Sexual activity: Not on file  Other Topics Concern  . Not on file  Social History Narrative  . Not on file   Social Determinants of Health   Financial Resource Strain: Not on file  Food Insecurity: Not on file  Transportation Needs: Not on file  Physical Activity: Not on file  Stress: Not on file  Social Connections: Not on file  Intimate Partner Violence: Not on file    Outpatient Medications Prior to Visit  Medication Sig Dispense Refill  . LO LOESTRIN FE 1 MG-10 MCG / 10 MCG tablet TK 1 T PO QD  12  . nitrofurantoin, macrocrystal-monohydrate, (MACROBID) 100 MG capsule Take 1 capsule (100 mg total) by mouth 2 (two) times daily. 10 capsule 0  . thyroid (ARMOUR) 32.5 MG tablet Take 32.5 mg by mouth daily.     No facility-administered medications  prior to visit.    No Known Allergies  Review of Systems  Constitutional: Positive for fever. Negative for appetite change and chills.  HENT: Negative for congestion and sore throat.   Respiratory: Negative for apnea, cough, choking, shortness of breath, wheezing and stridor.   Cardiovascular: Negative for chest pain.  Gastrointestinal: Negative for abdominal pain, diarrhea, nausea and vomiting.  Musculoskeletal: Negative for neck stiffness.  Skin: Positive for rash.  Psychiatric/Behavioral: Negative for agitation.       Objective:    Physical Exam Constitutional:      Appearance: Normal  appearance.  HENT:     Head: Normocephalic and atraumatic.  Eyes:     General: No scleral icterus. Pulmonary:     Effort: No respiratory distress.  Musculoskeletal:     Cervical back: Normal range of motion and neck supple.  Skin:    Findings: Rash present.     Comments: Multiple scattered erythematous papules and vesicles on the bilateral arms and legs, with associated itching. Dorsum of R am with linear streaking only.  No surrounding redness, skin warmth, induration, fluctuance, discharge, dermatomal distribution.   Neurological:     Mental Status: She is alert.     There were no vitals taken for this visit. Wt Readings from Last 3 Encounters:  02/16/16 146 lb (66.2 kg)  01/20/16 146 lb (66.2 kg)  01/19/16 146 lb (66.2 kg)    Health Maintenance Due  Topic Date Due  . Hepatitis C Screening  Never done  . HIV Screening  Never done  . TETANUS/TDAP  Never done  . COLONOSCOPY (Pts 45-61yr Insurance coverage will need to be confirmed)  Never done  . MAMMOGRAM  06/09/2014  . PAP SMEAR-Modifier  10/30/2017  . INFLUENZA VACCINE  Never done    There are no preventive care reminders to display for this patient.   No results found for: TSH No results found for: WBC, HGB, HCT, MCV, PLT No results found for: NA, K, CHLORIDE, CO2, GLUCOSE, BUN, CREATININE, BILITOT, ALKPHOS, AST, ALT, PROT, ALBUMIN, CALCIUM, ANIONGAP, EGFR, GFR No results found for: CHOL No results found for: HDL No results found for: LDLCALC No results found for: TRIG No results found for: CHOLHDL No results found for: HGBA1C     Assessment & Plan:   Problem List Items Addressed This Visit   None   Visit Diagnoses    Rash and nonspecific skin eruption    -  Primary   Relevant Medications   predniSONE (DELTASONE) 10 MG tablet       Meds ordered this encounter  Medications  . predniSONE (DELTASONE) 10 MG tablet    Sig: Take 6 tablets today. Decrease by one tablet daily until finished.    Dispense:   21 tablet    Refill:  0    Order Specific Question:   Supervising Provider    Answer:   MNoemi Chapel[[id number]]     SWaldon Merl PA-Cr identifies any concerns that need to be evaluated in person or the need to arrange testing such as labs, EKG, etc, we will make arrangements to do so.    Although advances in technology are sophisticated, we cannot ensure that it will always work on either your end or our end.  If the connection with a video visit is poor, we may have to switch to a telephone visit.  With either a video or telephone visit, we are not always able to ensure that we have a secure connection.  I need to obtain your verbal consent now.   Are you willing to proceed with your visit today?   Veronica Calderon has provided verbal consent on 05/17/2020 for a virtual visit (video or telephone).   Waldon Merl, PA-C 05/17/2020  12:36 PM   Veronica Lagrand Heloise Beecham, PA-C

## 2020-05-22 DIAGNOSIS — N840 Polyp of corpus uteri: Secondary | ICD-10-CM | POA: Diagnosis not present

## 2020-05-22 DIAGNOSIS — N950 Postmenopausal bleeding: Secondary | ICD-10-CM | POA: Diagnosis not present

## 2020-06-04 ENCOUNTER — Encounter: Payer: Self-pay | Admitting: Physician Assistant

## 2020-08-16 DIAGNOSIS — J205 Acute bronchitis due to respiratory syncytial virus: Secondary | ICD-10-CM | POA: Diagnosis not present

## 2020-12-31 DIAGNOSIS — L814 Other melanin hyperpigmentation: Secondary | ICD-10-CM | POA: Diagnosis not present

## 2020-12-31 DIAGNOSIS — L718 Other rosacea: Secondary | ICD-10-CM | POA: Diagnosis not present

## 2021-01-26 DIAGNOSIS — H2511 Age-related nuclear cataract, right eye: Secondary | ICD-10-CM | POA: Diagnosis not present

## 2021-01-26 DIAGNOSIS — H2512 Age-related nuclear cataract, left eye: Secondary | ICD-10-CM | POA: Diagnosis not present

## 2021-01-26 DIAGNOSIS — H52223 Regular astigmatism, bilateral: Secondary | ICD-10-CM | POA: Diagnosis not present

## 2021-01-26 DIAGNOSIS — H5203 Hypermetropia, bilateral: Secondary | ICD-10-CM | POA: Diagnosis not present

## 2021-01-26 DIAGNOSIS — H524 Presbyopia: Secondary | ICD-10-CM | POA: Diagnosis not present

## 2021-04-29 DIAGNOSIS — Z124 Encounter for screening for malignant neoplasm of cervix: Secondary | ICD-10-CM | POA: Diagnosis not present

## 2021-04-29 DIAGNOSIS — Z1231 Encounter for screening mammogram for malignant neoplasm of breast: Secondary | ICD-10-CM | POA: Diagnosis not present

## 2021-04-29 DIAGNOSIS — Z01419 Encounter for gynecological examination (general) (routine) without abnormal findings: Secondary | ICD-10-CM | POA: Diagnosis not present

## 2021-04-29 DIAGNOSIS — Z1151 Encounter for screening for human papillomavirus (HPV): Secondary | ICD-10-CM | POA: Diagnosis not present

## 2021-04-29 DIAGNOSIS — Z6824 Body mass index (BMI) 24.0-24.9, adult: Secondary | ICD-10-CM | POA: Diagnosis not present

## 2021-04-29 DIAGNOSIS — N950 Postmenopausal bleeding: Secondary | ICD-10-CM | POA: Diagnosis not present

## 2021-05-07 DIAGNOSIS — N950 Postmenopausal bleeding: Secondary | ICD-10-CM | POA: Diagnosis not present

## 2021-06-25 DIAGNOSIS — N951 Menopausal and female climacteric states: Secondary | ICD-10-CM | POA: Diagnosis not present

## 2021-11-24 DIAGNOSIS — D1801 Hemangioma of skin and subcutaneous tissue: Secondary | ICD-10-CM | POA: Diagnosis not present

## 2021-11-24 DIAGNOSIS — L821 Other seborrheic keratosis: Secondary | ICD-10-CM | POA: Diagnosis not present

## 2022-01-27 DIAGNOSIS — H524 Presbyopia: Secondary | ICD-10-CM | POA: Diagnosis not present

## 2022-01-27 DIAGNOSIS — H2513 Age-related nuclear cataract, bilateral: Secondary | ICD-10-CM | POA: Diagnosis not present

## 2022-01-27 DIAGNOSIS — H5203 Hypermetropia, bilateral: Secondary | ICD-10-CM | POA: Diagnosis not present

## 2022-01-27 DIAGNOSIS — H52223 Regular astigmatism, bilateral: Secondary | ICD-10-CM | POA: Diagnosis not present

## 2022-02-10 DIAGNOSIS — J060 Acute laryngopharyngitis: Secondary | ICD-10-CM | POA: Diagnosis not present

## 2022-02-10 DIAGNOSIS — J0100 Acute maxillary sinusitis, unspecified: Secondary | ICD-10-CM | POA: Diagnosis not present

## 2022-05-11 DIAGNOSIS — Z01419 Encounter for gynecological examination (general) (routine) without abnormal findings: Secondary | ICD-10-CM | POA: Diagnosis not present

## 2022-05-11 DIAGNOSIS — N951 Menopausal and female climacteric states: Secondary | ICD-10-CM | POA: Diagnosis not present

## 2022-05-11 DIAGNOSIS — Z1231 Encounter for screening mammogram for malignant neoplasm of breast: Secondary | ICD-10-CM | POA: Diagnosis not present

## 2022-05-11 DIAGNOSIS — Z6824 Body mass index (BMI) 24.0-24.9, adult: Secondary | ICD-10-CM | POA: Diagnosis not present

## 2022-11-22 DIAGNOSIS — H5203 Hypermetropia, bilateral: Secondary | ICD-10-CM | POA: Diagnosis not present

## 2022-11-22 DIAGNOSIS — H52223 Regular astigmatism, bilateral: Secondary | ICD-10-CM | POA: Diagnosis not present

## 2022-11-22 DIAGNOSIS — H524 Presbyopia: Secondary | ICD-10-CM | POA: Diagnosis not present

## 2023-02-25 DIAGNOSIS — L718 Other rosacea: Secondary | ICD-10-CM | POA: Diagnosis not present

## 2023-02-25 DIAGNOSIS — L821 Other seborrheic keratosis: Secondary | ICD-10-CM | POA: Diagnosis not present

## 2023-02-25 DIAGNOSIS — L819 Disorder of pigmentation, unspecified: Secondary | ICD-10-CM | POA: Diagnosis not present

## 2023-02-25 DIAGNOSIS — D225 Melanocytic nevi of trunk: Secondary | ICD-10-CM | POA: Diagnosis not present

## 2023-02-25 DIAGNOSIS — L820 Inflamed seborrheic keratosis: Secondary | ICD-10-CM | POA: Diagnosis not present

## 2024-02-28 ENCOUNTER — Other Ambulatory Visit: Payer: Self-pay | Admitting: Obstetrics and Gynecology

## 2024-02-28 DIAGNOSIS — R1033 Periumbilical pain: Secondary | ICD-10-CM

## 2024-03-16 ENCOUNTER — Encounter: Payer: Self-pay | Admitting: Radiology

## 2024-03-16 ENCOUNTER — Ambulatory Visit
Admission: RE | Admit: 2024-03-16 | Discharge: 2024-03-16 | Disposition: A | Discharge status: Home / Self Care | Source: Ambulatory Visit | Attending: Obstetrics and Gynecology | Admitting: Obstetrics and Gynecology

## 2024-03-16 DIAGNOSIS — R1033 Periumbilical pain: Secondary | ICD-10-CM

## 2024-03-16 MED ORDER — IOPAMIDOL (ISOVUE-300) INJECTION 61%
100.0000 mL | Freq: Once | INTRAVENOUS | Status: AC | PRN
  Administered 2024-03-16: 100 mL via INTRAVENOUS

## 2024-03-23 ENCOUNTER — Ambulatory Visit: Payer: Self-pay | Admitting: Surgery

## 2024-03-23 NOTE — H&P (View-Only) (Signed)
 "   REFERRING PHYSICIAN:  Mat Rosaline CROME, MD PROVIDER:  LEONOR MACARIO DAWN, MD MRN: [id number] DOB: [birth year] DATE OF ENCOUNTER: 03/23/2024 Subjective    Chief Complaint: New Consultation ( NEW PATIENT - ing hernia.)   History of Present Illness: Nadie Fiumara is a 59 y.o. female who is seen today as an office consultation for evaluation of New Consultation ( NEW PATIENT - ing hernia.)  She first noted a bulge in the left groin and had a CT in 2007, which showed a fat-containing femoral hernia. She was not have significant symptoms at that time. In the last few months, she has moved and did a lot of heavy lifting, and noticed a more prominent bulge in the left groin with some pain and discomfort. She also had discomfort in the pelvis. She has reduced her activity levels recently and has not had further pain, but the bulge is still present. She saw her GYN and had a CT scan on 1/2, which showed a fat-containing left femoral vs inguinal hernia. She was referred to discuss surgery.  She is in good health and does not take any blood thinners. She has not had any prior abdominal surgeries.      Review of Systems: A complete review of systems was obtained from the patient.  I have reviewed this information and discussed as appropriate with the patient.  See HPI as well for other ROS.   Medical History: History reviewed. No pertinent past medical history.  There is no problem list on file for this patient.   History reviewed. No pertinent surgical history.   No Known Allergies  Current Outpatient Medications on File Prior to Visit  Medication Sig Dispense Refill   estradiol  (DOTTI ) patch 0.075 mg/24hr Place 1 patch onto the skin twice a week     progesterone  (PROMETRIUM ) 100 MG capsule TAKE 1 CAPSULE BY MOUTH EVERY DAY AS DIRECTED     No current facility-administered medications on file prior to visit.    History reviewed. No pertinent family history.   Social History   Tobacco  Use  Smoking Status Never  Smokeless Tobacco Never     Social History   Socioeconomic History   Marital status: Married  Tobacco Use   Smoking status: Never   Smokeless tobacco: Never  Vaping Use   Vaping status: Never Used  Substance and Sexual Activity   Alcohol use: Yes    Alcohol/week: 2.0 - 6.0 standard drinks of alcohol    Types: 2 - 6 Standard drinks or equivalent per week   Drug use: Never   Sexual activity: Yes    Partners: Male   Social Drivers of Health   Housing Stability: Unknown (03/23/2024)   Housing Stability Vital Sign    Homeless in the Last Year: No    Objective:   Vitals:   03/23/24 1040  BP: 117/73  Pulse: 69  Temp: 36.5 C (97.7 F)  TempSrc: Temporal  SpO2: 99%  Weight: 71.1 kg (156 lb 12.8 oz)  Height: 167.6 cm (5' 6)  PainSc: 0-No pain    Body mass index is 25.31 kg/m.  Physical Exam Vitals reviewed.  Constitutional:      General: She is not in acute distress.    Appearance: Normal appearance.  Eyes:     General: No scleral icterus.    Conjunctiva/sclera: Conjunctivae normal.  Cardiovascular:     Rate and Rhythm: Normal rate and regular rhythm.     Heart sounds: No murmur heard. Pulmonary:  Effort: Pulmonary effort is normal. No respiratory distress.     Breath sounds: Normal breath sounds. No wheezing.  Abdominal:     General: There is no distension.     Palpations: Abdomen is soft.  Genitourinary:    Comments: Mass in the left groin, minimally tender to palpation, no overlying skin changes, not reducible in the supine position. Neurological:     General: No focal deficit present.     Mental Status: She is alert and oriented to person, place, and time.        Assessment and Plan:     Diagnoses and all orders for this visit:  Left femoral hernia without obstruction or gangrene    59 yo female with a known hernia in the left groin, which has been more symptomatic in recent months with activity. I  personally reviewed her labs, imaging, and notes. This appears to be a femoral hernia on her CT scan, containing chronically incarcerated omental fat. I discussed that the standard of care for femoral hernias is surgical repair, as the risk of incarceration is higher than for inguinal hernias. We discussed both laparoscopic and open approaches and after our discussion, will proceed with an open left femoral hernia repair with mesh. I reviewed the procedure details with the patient and all questions were answered. She will be contacted to schedule an elective surgery date.    SHELBY LYNN ALLEN, MD     "

## 2024-04-12 ENCOUNTER — Other Ambulatory Visit: Payer: Self-pay

## 2024-04-12 ENCOUNTER — Encounter (HOSPITAL_COMMUNITY): Payer: Self-pay | Admitting: Surgery

## 2024-04-12 NOTE — Progress Notes (Signed)
 SDW CALL  Patient was given pre-op instructions over the phone. The opportunity was given for the patient to ask questions. No further questions asked. Patient verbalized understanding of instructions given.   PCP - Rosaline Cobble Cardiologist - denies  PPM/ICD - denies Device Orders - n/a Rep Notified -  n/a  Chest x-ray - denies EKG - denies Stress Test - denies ECHO - denies Cardiac Cath - denies  Sleep Study - denies CPAP - n/a  NO DM  Last dose of GLP1 agonist-  n/a GLP1 instructions: n/a  Blood Thinner Instructions: n/a Aspirin Instructions: n/a  ERAS Protcol - clears until 0430 PRE-SURGERY Ensure or G2-  n/a  COVID TEST- n/a   Anesthesia review: yes - heart murmur as a child through college - not heard as an adult  Patient denies shortness of breath, fever, cough and chest pain over the phone call   All instructions explained to the patient, with a verbal understanding of the material. Patient agrees to go over the instructions while at home for a better understanding.

## 2024-04-13 NOTE — Progress Notes (Signed)
 Anesthesia Chart Review: Veronica Calderon  Case: [id number] Date/Time: 04/16/24 0715   Procedure: HERNIA REPAIR FEMORAL (Left) - OPEN LEFT FEMORAL HERNIA REPAIR   Anesthesia type: General   Pre-op diagnosis: LEFT FEMORAL HERNA   Location: [facility] ROOM 01 / [facility]   Surgeons: Dasie Leonor CROME, MD       DISCUSSION: Patient is a 59 year old female scheduled for the above procedure.  History includes never smoker, childhood murmur, left femoral hernia.  She is a same-day workup, so anesthesia team will have to evaluate further on the day of surgery.  She denied known murmur as an adult. No murmur by Dr. Verla 03/23/2024 exam.  Patient denied chest pain, shortness of breath, cough, fever per PAT RN phone interview.  VS: LMP 04/20/2012  VS on 03/23/2024 ([facility], [facility]) BP: 117/73  Pulse: 69  Temp: 36.5 C (97.7 F)  TempSrc: Temporal  SpO2: 99%  Weight: 71.1 kg (156 lb 12.8 oz)  Height: 167.6 cm (5' 6)  PainSc: 0-No pain    Body mass index is 25.31 kg/m.   PROVIDERS: Mat Browning, MD is GYN   LABS: For day of surgery as indicated. H/H 13.2/38.9, PLT 228 on 06/09/2023 ([facility])  IMAGES: CT Abd/pelvis 03/16/2024: IMPRESSION: - No acute findings within the abdomen or pelvis. - Small fat-containing left femoral or inguinal hernia, without significant change since 2007 exam.    EKG: N/A   CV: Denied prior stress test, cath, echo (per PAT RN phone interview)  Past Medical History:  Diagnosis Date   Heart murmur    as a child and through college but no longer heard as adult    Past Surgical History:  Procedure Laterality Date   BREAST BIOPSY Right 02/2006   Benign   COLONOSCOPY     HYSTEROSCOPY  2022    MEDICATIONS:  Biotin w/ Vitamins C & E (HAIR SKIN & NAILS GUMMIES PO)   Collagen-Vitamin C-Biotin (COLLAGEN PO)   estradiol  (VIVELLE -DOT) 0.075 MG/24HR   progesterone  (PROMETRIUM ) 100 MG capsule    Isaiah Ruder, PA-C Surgical Short Stay/Anesthesiology [facility] Phone  [phone] [facility] Phone [phone] 04/13/2024 12:03 PM

## 2024-04-13 NOTE — Anesthesia Preprocedure Evaluation (Signed)
 "                                  Anesthesia Evaluation  Patient identified by MRN, date of birth, ID band Patient awake    Reviewed: Allergy & Precautions, NPO status , Patient's Chart, lab work & pertinent test results  History of Anesthesia Complications Negative for: history of anesthetic complications  Airway Mallampati: II  TM Distance: >3 FB Neck ROM: Full    Dental no notable dental hx. (+) Teeth Intact, Implants, Dental Advisory Given   Pulmonary neg pulmonary ROS   Pulmonary exam normal breath sounds clear to auscultation       Cardiovascular (-) hypertension(-) angina (-) Past MI Normal cardiovascular exam Rhythm:Regular Rate:Normal     Neuro/Psych negative neurological ROS  negative psych ROS   GI/Hepatic ,neg GERD  ,,  Endo/Other  neg diabetes    Renal/GU      Musculoskeletal   Abdominal   Peds  Hematology Lab Results      Component                Value               Date                      WBC                      6.7                 04/16/2024                HGB                      13.7                04/16/2024                HCT                      39.4                04/16/2024                MCV                      94.7                04/16/2024                PLT                      190                 04/16/2024              Anesthesia Other Findings NKDA  Reproductive/Obstetrics                              Anesthesia Physical Anesthesia Plan  ASA: 1  Anesthesia Plan: General   Post-op Pain Management: Tylenol  PO (pre-op)*, Precedex  and Toradol  IV (intra-op)*   Induction:   PONV Risk Score and Plan: 3 and Treatment may vary due to age or medical condition, Midazolam , Dexamethasone  and Ondansetron   Airway Management Planned: LMA  Additional Equipment: None  Intra-op Plan:   Post-operative Plan: Extubation in OR  Informed Consent: I have reviewed the patients History and  Physical, chart, labs and discussed the procedure including the risks, benefits and alternatives for the proposed anesthesia with the patient or authorized representative who has indicated his/her understanding and acceptance.     Dental advisory given  Plan Discussed with: CRNA and Surgeon  Anesthesia Plan Comments: (PAT note written 04/13/2024 by Md Smola, PA-C.    )         Anesthesia Quick Evaluation  "

## 2024-04-16 ENCOUNTER — Encounter (HOSPITAL_COMMUNITY): Payer: Self-pay | Admitting: Vascular Surgery

## 2024-04-16 ENCOUNTER — Encounter (HOSPITAL_COMMUNITY): Payer: Self-pay | Admitting: Surgery

## 2024-04-16 ENCOUNTER — Encounter (HOSPITAL_COMMUNITY)
Admission: RE | Disposition: A | Discharge status: Home / Self Care | Payer: Self-pay | Source: Home / Self Care | Attending: Surgery

## 2024-04-16 ENCOUNTER — Ambulatory Visit (HOSPITAL_COMMUNITY)
Admission: RE | Admit: 2024-04-16 | Discharge: 2024-04-16 | Disposition: A | Discharge status: Home / Self Care | Attending: Surgery | Admitting: Surgery

## 2024-04-16 DIAGNOSIS — K4190 Unilateral femoral hernia, without obstruction or gangrene, not specified as recurrent: Secondary | ICD-10-CM | POA: Insufficient documentation

## 2024-04-16 HISTORY — DX: Cardiac murmur, unspecified: R01.1

## 2024-04-16 LAB — COMPREHENSIVE METABOLIC PANEL WITH GFR
ALT: 11 U/L (ref 0–44)
AST: 14 U/L — ABNORMAL LOW (ref 15–41)
Albumin: 4.4 g/dL (ref 3.5–5.0)
Alkaline Phosphatase: 64 U/L (ref 38–126)
Anion gap: 10 (ref 5–15)
BUN: 17 mg/dL (ref 6–20)
CO2: 25 mmol/L (ref 22–32)
Calcium: 9.1 mg/dL (ref 8.9–10.3)
Chloride: 104 mmol/L (ref 98–111)
Creatinine, Ser: 0.77 mg/dL (ref 0.44–1.00)
GFR, Estimated: >60 mL/min
Glucose, Bld: 95 mg/dL (ref 70–99)
Potassium: 3.7 mmol/L (ref 3.5–5.1)
Sodium: 138 mmol/L (ref 135–145)
Total Bilirubin: 0.4 mg/dL (ref 0.0–1.2)
Total Protein: 7.1 g/dL (ref 6.5–8.1)

## 2024-04-16 LAB — CBC
HCT: 39.4 % (ref 36.0–46.0)
Hemoglobin: 13.7 g/dL (ref 12.0–15.0)
MCH: 32.9 pg (ref 26.0–34.0)
MCHC: 34.8 g/dL (ref 30.0–36.0)
MCV: 94.7 fL (ref 80.0–100.0)
Platelets: 190 10*3/uL (ref 150–400)
RBC: 4.16 MIL/uL (ref 3.87–5.11)
RDW: 11.8 % (ref 11.5–15.5)
WBC: 6.7 10*3/uL (ref 4.0–10.5)
nRBC: 0 % (ref 0.0–0.2)

## 2024-04-16 MED ORDER — CELECOXIB 200 MG PO CAPS: 200.0000 mg | ORAL_CAPSULE | ORAL | Status: AC

## 2024-04-16 MED ORDER — EPHEDRINE 5 MG/ML INJ
INTRAVENOUS | Status: AC
  Filled 2024-04-16: qty 5

## 2024-04-16 MED ORDER — BUPIVACAINE-EPINEPHRINE (PF) 0.25% -1:200000 IJ SOLN
INTRAMUSCULAR | Status: AC
  Filled 2024-04-16: qty 30

## 2024-04-16 MED ORDER — CHLORHEXIDINE GLUCONATE 0.12 % MT SOLN
OROMUCOSAL | Status: AC
  Administered 2024-04-16: 15 mL via OROMUCOSAL
  Filled 2024-04-16: qty 15

## 2024-04-16 MED ORDER — STERILE WATER FOR IRRIGATION IR SOLN
Status: DC | PRN
  Administered 2024-04-16: 1000 mL

## 2024-04-16 MED ORDER — 0.9 % SODIUM CHLORIDE (POUR BTL) OPTIME
TOPICAL | Status: DC | PRN
  Administered 2024-04-16: 1000 mL

## 2024-04-16 MED ORDER — MIDAZOLAM HCL 2 MG/2ML IJ SOLN
INTRAMUSCULAR | Status: AC
  Filled 2024-04-16: qty 2

## 2024-04-16 MED ORDER — ACETAMINOPHEN 500 MG PO TABS: 1000.0000 mg | ORAL_TABLET | ORAL | Status: DC

## 2024-04-16 MED ORDER — PROPOFOL 10 MG/ML IV BOLUS
INTRAVENOUS | Status: DC | PRN
  Administered 2024-04-16: 100 mg via INTRAVENOUS

## 2024-04-16 MED ORDER — ACETAMINOPHEN 500 MG PO TABS: 1000.0000 mg | ORAL_TABLET | Freq: Once | ORAL | Status: AC

## 2024-04-16 MED ORDER — CEFAZOLIN SODIUM-DEXTROSE 2-4 GM/100ML-% IV SOLN
INTRAVENOUS | Status: AC
  Filled 2024-04-16: qty 100

## 2024-04-16 MED ORDER — DEXAMETHASONE SOD PHOSPHATE PF 10 MG/ML IJ SOLN
INTRAMUSCULAR | Status: AC
  Filled 2024-04-16: qty 1

## 2024-04-16 MED ORDER — ONDANSETRON HCL 4 MG/2ML IJ SOLN
INTRAMUSCULAR | Status: DC | PRN
  Administered 2024-04-16: 4 mg via INTRAVENOUS

## 2024-04-16 MED ORDER — HYDROCODONE-ACETAMINOPHEN 5-325 MG PO TABS: 1.0000 | ORAL_TABLET | Freq: Four times a day (QID) | ORAL | 0 refills | Status: AC | PRN

## 2024-04-16 MED ORDER — DEXMEDETOMIDINE HCL IN NACL 80 MCG/20ML IV SOLN
INTRAVENOUS | Status: DC | PRN
  Administered 2024-04-16: 8 ug via INTRAVENOUS

## 2024-04-16 MED ORDER — KETOROLAC TROMETHAMINE 30 MG/ML IJ SOLN: 30.0000 mg | Freq: Once | INTRAMUSCULAR | Status: DC | PRN

## 2024-04-16 MED ORDER — EPHEDRINE SULFATE-NACL 50-0.9 MG/10ML-% IV SOSY
PREFILLED_SYRINGE | INTRAVENOUS | Status: DC | PRN
  Administered 2024-04-16 (×3): 5 mg via INTRAVENOUS

## 2024-04-16 MED ORDER — LIDOCAINE 2% (20 MG/ML) 5 ML SYRINGE
INTRAMUSCULAR | Status: AC
  Filled 2024-04-16: qty 5

## 2024-04-16 MED ORDER — MIDAZOLAM HCL (PF) 2 MG/2ML IJ SOLN
INTRAMUSCULAR | Status: DC | PRN
  Administered 2024-04-16: 2 mg via INTRAVENOUS

## 2024-04-16 MED ORDER — CELECOXIB 200 MG PO CAPS
ORAL_CAPSULE | ORAL | Status: AC
  Administered 2024-04-16: 200 mg via ORAL
  Filled 2024-04-16: qty 1

## 2024-04-16 MED ORDER — OXYCODONE HCL 5 MG/5ML PO SOLN: 5.0000 mg | Freq: Once | ORAL | Status: DC | PRN

## 2024-04-16 MED ORDER — LIDOCAINE 2% (20 MG/ML) 5 ML SYRINGE
INTRAMUSCULAR | Status: DC | PRN
  Administered 2024-04-16: 80 mg via INTRAVENOUS

## 2024-04-16 MED ORDER — ORAL CARE MOUTH RINSE: 15.0000 mL | Freq: Once | OROMUCOSAL | Status: AC

## 2024-04-16 MED ORDER — PROPOFOL 10 MG/ML IV BOLUS
INTRAVENOUS | Status: AC
  Filled 2024-04-16: qty 20

## 2024-04-16 MED ORDER — FENTANYL CITRATE (PF) 250 MCG/5ML IJ SOLN
INTRAMUSCULAR | Status: DC | PRN
  Administered 2024-04-16: 25 ug via INTRAVENOUS

## 2024-04-16 MED ORDER — CEFAZOLIN SODIUM-DEXTROSE 2-4 GM/100ML-% IV SOLN
2.0000 g | INTRAVENOUS | Status: AC
  Administered 2024-04-16: 2 g via INTRAVENOUS

## 2024-04-16 MED ORDER — LACTATED RINGERS IV SOLN: INTRAVENOUS | Status: DC

## 2024-04-16 MED ORDER — DEXAMETHASONE SOD PHOSPHATE PF 10 MG/ML IJ SOLN
INTRAMUSCULAR | Status: DC | PRN
  Administered 2024-04-16: 10 mg via INTRAVENOUS

## 2024-04-16 MED ORDER — DEXMEDETOMIDINE HCL IN NACL 80 MCG/20ML IV SOLN
INTRAVENOUS | Status: AC
  Filled 2024-04-16: qty 20

## 2024-04-16 MED ORDER — FENTANYL CITRATE (PF) 100 MCG/2ML IJ SOLN
INTRAMUSCULAR | Status: AC
  Filled 2024-04-16: qty 2

## 2024-04-16 MED ORDER — ACETAMINOPHEN 500 MG PO TABS
ORAL_TABLET | ORAL | Status: AC
  Administered 2024-04-16: 1000 mg via ORAL
  Filled 2024-04-16: qty 2

## 2024-04-16 MED ORDER — OXYCODONE HCL 5 MG PO TABS: 5.0000 mg | ORAL_TABLET | Freq: Once | ORAL | Status: DC | PRN

## 2024-04-16 MED ORDER — CHLORHEXIDINE GLUCONATE 0.12 % MT SOLN: 15.0000 mL | Freq: Once | OROMUCOSAL | Status: AC

## 2024-04-16 MED ORDER — ONDANSETRON HCL 4 MG/2ML IJ SOLN
INTRAMUSCULAR | Status: AC
  Filled 2024-04-16: qty 2

## 2024-04-16 MED ORDER — BUPIVACAINE-EPINEPHRINE (PF) 0.25% -1:200000 IJ SOLN
INTRAMUSCULAR | Status: DC | PRN
  Administered 2024-04-16: 30 mL

## 2024-04-16 MED ORDER — ONDANSETRON HCL 4 MG/2ML IJ SOLN: 4.0000 mg | Freq: Once | INTRAMUSCULAR | Status: DC | PRN

## 2024-04-16 MED ORDER — HYDROMORPHONE HCL 1 MG/ML IJ SOLN: 0.2500 mg | INTRAMUSCULAR | Status: DC | PRN

## 2024-04-16 NOTE — Interval H&P Note (Signed)
 History and Physical Interval Note:  04/16/2024 7:13 AM  Veronica Calderon  has presented today for surgery, with the diagnosis of LEFT FEMORAL HERNA.  The various methods of treatment have been discussed with the patient and family. After consideration of risks, benefits and other options for treatment, the patient has consented to  Procedures with comments: HERNIA REPAIR FEMORAL (Left) - OPEN LEFT FEMORAL HERNIA REPAIR as a surgical intervention.  The patient's history has been reviewed, patient examined, no change in status, stable for surgery. Surgical site confirmed and marked. I have reviewed the patient's chart and labs.  Questions were answered to the patient's satisfaction.     Leonor LITTIE Dawn

## 2024-04-16 NOTE — Op Note (Signed)
 Date: 04/16/24  Patient: CHARNELE SEMPLE MRN: [id number]  Preoperative Diagnosis: Left femoral hernia Postoperative Diagnosis: Same  Procedure: Open left femoral hernia repair  Surgeon: Leonor Dawn, MD  EBL: Minimal  Anesthesia: General LMA  Specimens: None  Indications: Ms. Tess is a 59 yo female who was referred with a bulge in the left groin, which has been present for many years. Previous imaging was consistent with a fat-containing left inguinal hernia. She has had increasing symptoms recently, particularly with activity. After a discussion if the risks and benefits of surgery, she consented to proceed with repair.  Findings: Left femoral hernia containing omental fat. Repaired with an Ultrapro mesh plug.  Procedure details: Informed consent was obtained in the preoperative area prior to the procedure. The patient was brought to the operating room and placed on the table in the supine position. General anesthesia was induced and appropriate lines and drains were placed for intraoperative monitoring. Perioperative antibiotics were administered per SCIP guidelines. The left groin was prepped and draped in the usual sterile fashion. A pre-procedure timeout was taken verifying patient identity, surgical site and procedure to be performed.  A transverse skin incision was made overlying the left groin, and the subcutaneous tissue was divided with cautery. Scarpa's layer was opened, and deep to this a hernia sac was encountered. The sac was circumferentially dissected out of the subcutaneous tissue using blunt dissection and cautery. The neck of the hernia passed deep to the inguinal ligament, consistent with a femoral hernia. The sac and it's contents were not able to be reduced, so the sac was opened. The sac contained viable omental fat. The omentum was amputated using cautery near the base of the sac and discarded. The sac was ligated with 3-0 Vicryl suture, and then fully reduced back into  the abdomen. The femoral hernia defect was approximately 8mm in diameter. A sheet of 3x6cm Ultrapro mesh was brought onto the field, cut in half, and rolled up to create a small plug. The plug was inserted into the hernia defect. The mesh plug was secured superiorly to the inguinal ligament and medially to Cooper's ligament using 0 PDS suture. No sutures were placed laterally, to avoid injury to the femoral vessels. The wound appeared hemostatic. Scarpa's layer was closed using a running 2-0 Vicryl suture and the deep dermis was closed with running 3-0 Vicryl suture.  The subcuticular layer was closed with running subcuticular 4-0 monocryl suture. Dermabond was applied.  The patient tolerated the procedure well with no apparent complications. All counts were correct x2 at the end of the procedure. The patient was extubated and taken to PACU in stable condition.  Leonor Dawn, MD 04/16/24 8:15 AM

## 2024-04-17 ENCOUNTER — Encounter (HOSPITAL_COMMUNITY): Payer: Self-pay | Admitting: Surgery
